# Patient Record
Sex: Female | Born: 1975
Health system: Southern US, Community
[De-identification: ages and names within clinical notes are randomized; demographics above are authoritative.]

## PROBLEM LIST (undated history)

## (undated) DIAGNOSIS — J302 Other seasonal allergic rhinitis: Secondary | ICD-10-CM

## (undated) DIAGNOSIS — I10 Essential (primary) hypertension: Secondary | ICD-10-CM

## (undated) DIAGNOSIS — D649 Anemia, unspecified: Secondary | ICD-10-CM

## (undated) HISTORY — PX: ESSURE TUBAL LIGATION: SUR464

## (undated) HISTORY — PX: LEG SURGERY: SHX1003

## (undated) HISTORY — DX: Essential (primary) hypertension: I10

## (undated) HISTORY — PX: LEEP: SHX91

## (undated) HISTORY — DX: Other seasonal allergic rhinitis: J30.2

---

## 2016-04-11 DIAGNOSIS — M7918 Myalgia, other site: Secondary | ICD-10-CM | POA: Insufficient documentation

## 2017-01-12 ENCOUNTER — Ambulatory Visit (INDEPENDENT_AMBULATORY_CARE_PROVIDER_SITE_OTHER): Payer: BC Managed Care – PPO | Admitting: Obstetrics and Gynecology

## 2017-01-12 ENCOUNTER — Encounter: Payer: Self-pay | Admitting: Obstetrics and Gynecology

## 2017-01-12 VITALS — BP 138/91 | HR 101 | Ht 60.0 in | Wt 169.3 lb

## 2017-01-12 DIAGNOSIS — Z1329 Encounter for screening for other suspected endocrine disorder: Secondary | ICD-10-CM | POA: Diagnosis not present

## 2017-01-12 DIAGNOSIS — N920 Excessive and frequent menstruation with regular cycle: Secondary | ICD-10-CM

## 2017-01-12 DIAGNOSIS — R102 Pelvic and perineal pain: Secondary | ICD-10-CM

## 2017-01-12 DIAGNOSIS — E559 Vitamin D deficiency, unspecified: Secondary | ICD-10-CM

## 2017-01-12 DIAGNOSIS — Z131 Encounter for screening for diabetes mellitus: Secondary | ICD-10-CM | POA: Diagnosis not present

## 2017-01-12 DIAGNOSIS — Z01419 Encounter for gynecological examination (general) (routine) without abnormal findings: Secondary | ICD-10-CM

## 2017-01-12 DIAGNOSIS — Z1231 Encounter for screening mammogram for malignant neoplasm of breast: Secondary | ICD-10-CM | POA: Diagnosis not present

## 2017-01-12 DIAGNOSIS — R1900 Intra-abdominal and pelvic swelling, mass and lump, unspecified site: Secondary | ICD-10-CM

## 2017-01-12 DIAGNOSIS — Z862 Personal history of diseases of the blood and blood-forming organs and certain disorders involving the immune mechanism: Secondary | ICD-10-CM

## 2017-01-12 DIAGNOSIS — Z9851 Tubal ligation status: Secondary | ICD-10-CM

## 2017-01-12 MED ORDER — TRANEXAMIC ACID 650 MG PO TABS: 1300.0000 mg | ORAL_TABLET | Freq: Three times a day (TID) | ORAL | 2 refills | Status: DC

## 2017-01-12 NOTE — Progress Notes (Signed)
GYNECOLOGY ANNUAL PHYSICAL EXAM PROGRESS NOTE  Subjective:    Donna Perry is a 41 y.o. G31P1001 female who presents for an annual exam. The patient is sexually active. The patient wears seatbelts: yes. The patient participates in regular exercise: no. Has the patient ever been transfused or tattooed?: no. The patient reports that there is not domestic violence in her life.    The patient has the following complaints today:  1. Patient notes that since having her Essure performed in 2011 she has gradually had the onset of more painful cycles and heavy vaginal bleeding. Pain is noted approximately 1 week prior to menses, and lasts through the first 1-2 days of the menstrual cycle. Patient takes Motrin scheduled during this week which does help some. She is also noting that during the first 1-2 days of her cycles she has to frequently change her pad approximately every 2 hours as the flow is the heaviest during this time, also with passage of clots. She has tried different birth control methods to help with her symptoms including Depo-Provera and combined birth control pills, however this caused her bleeding to become even heavier.   2.  Patient also notes having a history of recurrent vaginitis.  Notes that it varies between yeast infections and bacterial vaginosis. Usually the infections occur after her menstrual cycle. Thinks she may even have a yeast infection today.  Gynecologic History  Menarche age: 54 Patient's last menstrual period was 12/22/2016. Period Duration (Days): 5 Period Pattern: Regular Menstrual Flow: Heavy Dysmenorrhea: (!) Moderate Dysmenorrhea Symptoms: Cramping  Contraception: tubal ligation (Essure) History of STI's: Denies Last Pap: 2015. Results were: normal.  Remote h/o abnormal pap smears. H/o LEEP.  Last mammogram: never had one.   Obstetric History   G1   P1   T1   P0   A0   L1    SAB0   TAB0   Ectopic0   Multiple0   Live Births1     # Outcome Date GA Lbr  Len/2nd Weight Sex Delivery Anes PTL Lv  1 Term     M Vag-Spont   LIV      Past Medical History:  Diagnosis Date  . Hypertension   . Seasonal allergies     Past Surgical History:  Procedure Laterality Date  . ESSURE TUBAL LIGATION    . LEEP      Family History  Problem Relation Age of Onset  . Cervical cancer Mother   . Lung cancer Father   . Breast cancer Neg Hx   . Heart failure Neg Hx   . Seizures Neg Hx     Social History   Social History  . Marital status: Single    Spouse name: N/A  . Number of children: N/A  . Years of education: N/A   Occupational History  . Not on file.   Social History Main Topics  . Smoking status: Never Smoker  . Smokeless tobacco: Never Used  . Alcohol use Yes     Comment: Socially  . Drug use: No  . Sexual activity: Yes    Birth control/ protection: Surgical   Other Topics Concern  . Not on file   Social History Narrative  . No narrative on file    No current outpatient prescriptions on file prior to visit.   No current facility-administered medications on file prior to visit.     Allergies  Allergen Reactions  . Sulfamethoxazole-Trimethoprim Hives  . Sulfur  Review of Systems Constitutional: negative for chills, fatigue, fevers and sweats Eyes: negative for irritation, redness and visual disturbance Ears, nose, mouth, throat, and face: negative for hearing loss, nasal congestion, snoring and tinnitus Respiratory: negative for asthma, cough, sputum Cardiovascular: negative for chest pain, dyspnea, exertional chest pressure/discomfort, irregular heart beat, palpitations and syncope Gastrointestinal: negative for abdominal pain, change in bowel habits, nausea and vomiting Genitourinary: positive abnormal menstrual periods (heavy cycles and pelvic pain), positive for recurrent vaginal infections.  Negative for genital lesions, sexual problems and vaginal discharge, dysuria and urinary  incontinence Integument/breast: negative for breast lump, breast tenderness and nipple discharge Hematologic/lymphatic: negative for bleeding and easy bruising Musculoskeletal:negative for back pain and muscle weakness Neurological: negative for dizziness, headaches, vertigo and weakness Endocrine: negative for diabetic symptoms including polydipsia, polyuria and skin dryness Allergic/Immunologic: negative for hay fever and urticaria        Objective:  Blood pressure (!) 138/91, pulse (!) 101, height 5' (1.524 m), weight 169 lb 4.8 oz (76.8 kg), last menstrual period 12/22/2016. Body mass index is 33.06 kg/m.  General Appearance:    Alert, cooperative, no distress, appears stated age, obesity  Head:    Normocephalic, without obvious abnormality, atraumatic  Eyes:    PERRL, conjunctiva/corneas clear, EOM's intact, both eyes  Ears:    Normal external ear canals, both ears  Nose:   Nares normal, septum midline, mucosa normal, no drainage or sinus tenderness  Throat:   Lips, mucosa, and tongue normal; teeth and gums normal  Neck:   Supple, symmetrical, trachea midline, no adenopathy; thyroid: no enlargement/tenderness/nodules; no carotid bruit or JVD  Back:     Symmetric, no curvature, ROM normal, no CVA tenderness  Lungs:     Clear to auscultation bilaterally, respirations unlabored  Chest Wall:    No tenderness or deformity   Heart:    Regular rate and rhythm, S1 and S2 normal, no murmur, rub or gallop  Breast Exam:    No tenderness, masses, or nipple abnormality  Abdomen:     Soft, non-tender, bowel sounds active all four quadrants, no masses, no organomegaly.    Genitalia:    Pelvic:external genitalia normal, vagina without lesions, discharge, or tenderness, rectovaginal septum  normal. Cervix normal in appearance, no cervical motion tenderness, no adnexal masses or tenderness.  Uterus normal size, but large firm smooth mass on posterior surface, ~ 4-5 cm, regular contours, nontender.   Rectal:    Normal external sphincter.  No hemorrhoids appreciated. Internal exam not done.   Extremities:   Extremities normal, atraumatic, no cyanosis or edema  Pulses:   2+ and symmetric all extremities  Skin:   Skin color, texture, turgor normal, no rashes or lesions  Lymph nodes:   Cervical, supraclavicular, and axillary nodes normal  Neurologic:   CNII-XII intact, normal strength, sensation and reflexes throughout   .  Labs:  No results found for: WBC, HGB, HCT, MCV, PLT  No results found for: CREATININE, BUN, NA, K, CL, CO2  No results found for: ALT, AST, GGT, ALKPHOS, BILITOT  No results found for: TSH   Assessment:   Healthy female exam.   H/o Essure tubal ligation Pelvic pain Heavy menses H/o recurrent vaginitis H/o vitamin D deficiency H/o iron deficiency Pelvic mass   Plan:     Blood tests: CBC with diff, Comprehensive metabolic panel, Lipoproteins and TSH, Vitamin D, HgbA1c, Iron studies. Breast self exam technique reviewed and patient encouraged to perform self-exam monthly. Contraception: tubal ligation (Essure).  Discussed healthy lifestyle modifications. Pap smear performed today.   Iron studies for h/o iron deficiency Discussed options for management of pelvic pain with heavy menses, including Mirena IUD, daily progesterone OCP, Essure removal (would require referral to specialty center) and hysterectomy. Patient would like external referral to specialty center.  Follow up in 3-4 weeks for ultrasound, and in 1 year.     Rubie Maid, MD [facility]

## 2017-01-13 LAB — COMPREHENSIVE METABOLIC PANEL
A/G RATIO: 1.7 (ref 1.2–2.2)
ALBUMIN: 4.6 g/dL (ref 3.5–5.5)
ALK PHOS: 73 IU/L (ref 39–117)
ALT: 18 IU/L (ref 0–32)
AST: 21 IU/L (ref 0–40)
BILIRUBIN TOTAL: 0.3 mg/dL (ref 0.0–1.2)
BUN / CREAT RATIO: 12 (ref 9–23)
BUN: 9 mg/dL (ref 6–24)
CHLORIDE: 100 mmol/L (ref 96–106)
CO2: 27 mmol/L (ref 20–29)
Calcium: 9.6 mg/dL (ref 8.7–10.2)
Creatinine, Ser: 0.77 mg/dL (ref 0.57–1.00)
GFR calc non Af Amer: 97 mL/min/{1.73_m2} (ref >59)
GFR, EST AFRICAN AMERICAN: 112 mL/min/{1.73_m2} (ref >59)
GLOBULIN, TOTAL: 2.7 g/dL (ref 1.5–4.5)
Glucose: 67 mg/dL (ref 65–99)
Potassium: 3.4 mmol/L — ABNORMAL LOW (ref 3.5–5.2)
SODIUM: 144 mmol/L (ref 134–144)
TOTAL PROTEIN: 7.3 g/dL (ref 6.0–8.5)

## 2017-01-13 LAB — CBC
Hematocrit: 37.6 % (ref 34.0–46.6)
Hemoglobin: 12.4 g/dL (ref 11.1–15.9)
MCH: 27.6 pg (ref 26.6–33.0)
MCHC: 33 g/dL (ref 31.5–35.7)
MCV: 84 fL (ref 79–97)
PLATELETS: 374 10*3/uL (ref 150–379)
RBC: 4.5 x10E6/uL (ref 3.77–5.28)
RDW: 14 % (ref 12.3–15.4)
WBC: 10.2 10*3/uL (ref 3.4–10.8)

## 2017-01-13 LAB — HEMOGLOBIN A1C
Est. average glucose Bld gHb Est-mCnc: 108 mg/dL
HEMOGLOBIN A1C: 5.4 % (ref 4.8–5.6)

## 2017-01-13 LAB — TSH: TSH: 0.853 u[IU]/mL (ref 0.450–4.500)

## 2017-01-13 LAB — VITAMIN D 25 HYDROXY (VIT D DEFICIENCY, FRACTURES): Vit D, 25-Hydroxy: 38.7 ng/mL (ref 30.0–100.0)

## 2017-01-13 LAB — FERRITIN: FERRITIN: 16 ng/mL (ref 15–150)

## 2017-01-14 LAB — PAP IG AND HPV HIGH-RISK
HPV, HIGH-RISK: NEGATIVE
PAP SMEAR COMMENT: 0

## 2017-01-15 ENCOUNTER — Encounter: Payer: Self-pay | Admitting: Obstetrics and Gynecology

## 2017-01-19 ENCOUNTER — Telehealth: Payer: Self-pay

## 2017-01-19 NOTE — Telephone Encounter (Signed)
Called pt informed her of information below. Gave pt education on which foods to increase. Pt gave verbal understanding.

## 2017-01-19 NOTE — Telephone Encounter (Signed)
-----   Message from Rubie Maid, MD sent at 01/17/2017 11:12 PM EDT ----- Please inform patient that all of her labs were normal, including pap smear, except that she is slightly low in potassium. Advise to eat potassium-rich foods

## 2017-02-03 ENCOUNTER — Ambulatory Visit (INDEPENDENT_AMBULATORY_CARE_PROVIDER_SITE_OTHER): Payer: BC Managed Care – PPO | Admitting: Obstetrics and Gynecology

## 2017-02-03 ENCOUNTER — Ambulatory Visit (INDEPENDENT_AMBULATORY_CARE_PROVIDER_SITE_OTHER): Payer: BC Managed Care – PPO

## 2017-02-03 VITALS — BP 136/91 | HR 74 | Ht 60.0 in | Wt 168.5 lb

## 2017-02-03 DIAGNOSIS — N9989 Other postprocedural complications and disorders of genitourinary system: Secondary | ICD-10-CM | POA: Diagnosis not present

## 2017-02-03 DIAGNOSIS — N920 Excessive and frequent menstruation with regular cycle: Secondary | ICD-10-CM | POA: Diagnosis not present

## 2017-02-03 DIAGNOSIS — Z9851 Tubal ligation status: Secondary | ICD-10-CM | POA: Diagnosis not present

## 2017-02-03 DIAGNOSIS — D251 Intramural leiomyoma of uterus: Secondary | ICD-10-CM

## 2017-02-03 NOTE — Progress Notes (Signed)
GYNECOLOGY PROGRESS NOTE  Subjective:    Patient ID: Donna Perry, female    DOB: November 28, 1975, 41 y.o.   MRN: [id number]  HPI  Patient is a 41 y.o. G72P1001 female who presents for f/u of ultrasound results. She is currently being evaluated for h/o fibroids and heavy menstrual cycles (usually first 2 days) and pelvic pain (although this is thought to be caused by her Essure).   The following portions of the patient's history were reviewed and updated as appropriate: allergies, current medications, past family history, past medical history, past social history, past surgical history and problem list.  Review of Systems Pertinent items noted in HPI and remainder of comprehensive ROS otherwise negative.   Objective:   Blood pressure (!) 136/91, pulse 74, height 5' (1.524 m), weight 168 lb 8 oz (76.4 kg), last menstrual period 01/20/2017. General appearance: alert and no distress Remainder of exam deferred.    Labs:  Lab Results  Component Value Date   WBC 10.2 01/12/2017   HGB 12.4 01/12/2017   HCT 37.6 01/12/2017   MCV 84 01/12/2017   PLT 374 01/12/2017    Lab Results  Component Value Date   TSH 0.853 01/12/2017    Imaging:   ULTRASOUND REPORT  Location: [facility] Date of Service: 02/03/17   Indications:AUB Findings:  The uterus measures . Echo texture is heterogenous with evidence of focal mass. Within the uterus is a suspected fibroid measuring: Fibroid 1: left fundal intramural 1.4 x 1.9 x 1.4 cm.  The Endometrium measures 7.3 mm.  Right Ovary measures 2.8 x 2 x 1.5 cm. It is normal in appearance. Left Ovary measures 2.5 x 1.5 x 1.3 cm. It is normal appearance. Survey of the adnexa demonstrates no adnexal masses. There is no free fluid in the cul de sac.  Impression: 1. fibroid  Recommendations: 1.Clinical correlation with the patient's History and Physical Exam.  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 Reviewed ultrasound results with patient. Discussed that her fibroid was small, and not a likely cause of her heavy menses.  Patient notes that it has not changed in size much from the last time she had an ultrasound several years ago.  2. Discussed management options for heavy menses again.  Patient was prescribed Lysteda for her last menstrual cycle.  Notes that it made her period lighter, however also caused it to be 2 days longer (8 days instead of 6).  Notes that she will continue to use for now, as her main concern is her pelvic pain. Once her pain is managed, she will consider other options for management.  3. Patient notes pelvic pain ~ 1-2 weeks prior to her cycle, lasting until 2 days into her cycle.  Has been taking Ibuprofen 800 mg, which had offered relief in the past, however now is becoming less effective. Patient notes that she still desires Essure removal as she believes this has been the cause of most of her symptoms (including the change in the flow of her menses).  Informed that she should contact the closest facilities that specialize in Essure removals for a consultation.    To f/u as needed, after Essure removal.    A total of 15 minutes were spent face-to-face with the patient during this encounter and over half of that time dealt with counseling and coordination of care.   Rubie Maid, MD [facility]

## 2017-02-03 NOTE — Patient Instructions (Signed)
Pelvic Pain, Female Pelvic pain is pain in your lower abdomen, below your belly button and between your hips. The pain may start suddenly (acute), keep coming back (recurring), or last a long time (chronic). Pelvic pain that lasts longer than six months is considered chronic. Pelvic pain may affect your:  Reproductive organs.  Urinary system.  Digestive tract.  Musculoskeletal system.  There are many potential causes of pelvic pain. Sometimes, the pain can be a result of digestive or urinary conditions, strained muscles or ligaments, or even reproductive conditions. Sometimes the cause of pelvic pain is not known. Follow these instructions at home:  Take over-the-counter and prescription medicines only as told by your health care provider.  Rest as told by your health care provider.  Do not have sex it if hurts.  Keep a journal of your pelvic pain. Write down: ? When the pain started. ? Where the pain is located. ? What seems to make the pain better or worse, such as food or your menstrual cycle. ? Any symptoms you have along with the pain.  Keep all follow-up visits as told by your health care provider. This is important. Contact a health care provider if:  Medicine does not help your pain.  Your pain comes back.  You have new symptoms.  You have abnormal vaginal discharge or bleeding, including bleeding after menopause.  You have a fever or chills.  You are constipated.  You have blood in your urine or stool.  You have foul-smelling urine.  You feel weak or lightheaded. Get help right away if:  You have sudden severe pain.  Your pain gets steadily worse.  You have severe pain along with fever, nausea, vomiting, or excessive sweating.  You lose consciousness. This information is not intended to replace advice given to you by your health care provider. Make sure you discuss any questions you have with your health care provider. Document Released: 06/17/2004  Document Revised: 08/15/2015 Document Reviewed: 05/11/2015 Elsevier Interactive Patient Education  2018 [employer]  

## 2017-04-08 DIAGNOSIS — S92022A Displaced fracture of anterior process of left calcaneus, initial encounter for closed fracture: Secondary | ICD-10-CM | POA: Insufficient documentation

## 2017-05-02 ENCOUNTER — Encounter: Payer: Self-pay | Admitting: Obstetrics and Gynecology

## 2017-05-06 ENCOUNTER — Encounter: Payer: Self-pay | Admitting: Obstetrics and Gynecology

## 2017-05-06 ENCOUNTER — Ambulatory Visit (INDEPENDENT_AMBULATORY_CARE_PROVIDER_SITE_OTHER): Payer: BC Managed Care – PPO | Admitting: Obstetrics and Gynecology

## 2017-05-06 VITALS — BP 121/83 | HR 101

## 2017-05-06 DIAGNOSIS — Z9851 Tubal ligation status: Secondary | ICD-10-CM

## 2017-05-06 DIAGNOSIS — N938 Other specified abnormal uterine and vaginal bleeding: Secondary | ICD-10-CM

## 2017-05-06 DIAGNOSIS — N9989 Other postprocedural complications and disorders of genitourinary system: Secondary | ICD-10-CM | POA: Diagnosis not present

## 2017-05-06 NOTE — Patient Instructions (Addendum)
Dysfunctional Uterine Bleeding °Dysfunctional uterine bleeding is abnormal bleeding from the uterus. Dysfunctional uterine bleeding includes: °· A period that comes earlier or later than usual. °· A period that is lighter, heavier, or has blood clots. °· Bleeding between periods. °· Skipping one or more periods. °· Bleeding after sexual intercourse. °· Bleeding after menopause. ° °Follow these instructions at home: °Pay attention to any changes in your symptoms. Follow these instructions to help with your condition: °Eating and drinking °· Eat well-balanced meals. Include foods that are high in iron, such as liver, meat, shellfish, green leafy vegetables, and eggs. °· If you become constipated: °? Drink plenty of water. °? Eat fruits and vegetables that are high in water and fiber, such as spinach, carrots, raspberries, apples, and mango. °Medicines °· Take over-the-counter and prescription medicines only as told by your health care provider. °· Do not change medicines without talking with your health care provider. °· Aspirin or medicines that contain aspirin may make the bleeding worse. Do not take those medicines: °? During the week before your period. °? During your period. °· If you were prescribed iron pills, take them as told by your health care provider. Iron pills help to replace iron that your body loses because of this condition. °Activity °· If you need to change your sanitary pad or tampon more than one time every 2 hours: °? Lie in bed with your feet raised (elevated). °? Place a cold pack on your lower abdomen. °? Rest as much as possible until the bleeding stops or slows down. °· Do not try to lose weight until the bleeding has stopped and your blood iron level is back to normal. °Other Instructions °· For two months, write down: °? When your period starts. °? When your period ends. °? When any abnormal bleeding occurs. °? What problems you notice. °· Keep all follow up visits as told by your health  care provider. This is important. °Contact a health care provider if: °· You get light-headed or weak. °· You have nausea and vomiting. °· You cannot eat or drink without vomiting. °· You feel dizzy or have diarrhea while you are taking medicines. °· You are taking birth control pills or hormones, and you want to change them or stop taking them. °Get help right away if: °· You develop a fever or chills. °· You need to change your sanitary pad or tampon more than one time per hour. °· Your bleeding becomes heavier, or your flow contains clots more often. °· You develop pain in your abdomen. °· You lose consciousness. °· You develop a rash. °This information is not intended to replace advice given to you by your health care provider. Make sure you discuss any questions you have with your health care provider. °Document Released: 07/18/2000 Document Revised: 12/27/2015 Document Reviewed: 10/16/2014 °Elsevier Interactive Patient Education © 2018 Elsevier Inc. ° °

## 2017-05-06 NOTE — Progress Notes (Signed)
Pt is here with c/o abnormal vaginal bleeding. Started spotting 04/21/17, starting bleeding 04/29/17 not as heavy as usual, started bleeding heavier more normal flow on 05/03/17.Pt has Esure implant.

## 2017-05-06 NOTE — Progress Notes (Signed)
    GYNECOLOGY PROGRESS NOTE  Subjective:    Patient ID: Donna Perry, female    DOB: [birth year], 41 y.o.   MRN: [id number]  HPI  Patient is a 41 y.o. G64P1001 female who presents for complaints of dysfunctional uterine bleeding.  Notes that she started spotting on September 18 til April 29, 2017. Her cycle was not suppose to come on until September 29,2018.  She started to bleed a little more on September 27,2018 which was a little more than the spotting. She was also noting pelvic pain that was light and started on September 29,2018. Notes that by this time, her bleeding became heavier, which she believes was her normal cycle.  Cycle still ongoing, periods typically last 5-6 days.  Patient does have a h/o post-tubal ligation syndrome (with Essure), which she is working on seeking a specialist for Essure removal.  Has been on Lysteda for the past 2 months for her heavy periods. Notes that it had been working to decrease the flow, however it caused her periods to last 1-2 days longer.  Prior ultrasound performed in July noted essentially normal sono with a fundal 1.5 cm fibroid.   Of note, patient notes that she also started on some new medications, and also recently underwent surgery for her foot during this same time as some of these abnormalities started to ocur. .     The following portions of the patient's history were reviewed and updated as appropriate: allergies, current medications, past family history, past medical history, past social history, past surgical history and problem list.  Review of Systems Pertinent items noted in HPI and remainder of comprehensive ROS otherwise negative.   Objective:   Blood pressure 121/83, pulse (!) 101, last menstrual period 04/21/2017. General appearance: alert and no distress Abdomen: soft, non-tender; bowel sounds normal; no masses,  no organomegaly Pelvic: deferred, as patient unable to get on exam table due to leg contraption (orthopedic boot, not  supposed to be weight bearing).  Extremities: right extremity normal, atraumatic, no cyanosis or edema.  Left extremity in orthopedic boot, reaches up to knee.  Neurologic: Grossly intact.  Skin: no lesions, rashes.  Normal color and turgor.   Assessment:   Dysfunctional uterine bleeding Post-tubal ligation syndrome  Plan:   1. Dysfunctional bleeding.  Patient has had a h/o heavy menses, however this is the first episode of intermenstrual bleeding.  Discussed that it could be due to a number of causes, including recent stressful event of surgery, anesthesia, beginning several new medications, or could be completely unrelated.  Advised on waiting until next month to see if episode of intermenstrual bleeding occurs again.  If it does, can recommend trial of progesterone such as Provera or Aygestin (and discontinue Lysteda).  Would also recommend further evaluation at that time with endometrial biopsy.  Patient notes understanding.  To call if symptoms worsen or fail to improve.  2. Patient with a h/o pelvic pain secondary to post-tubal ligation syndrome prior to and during menses, however notes pain had improved with this cycle.  Is still seeking a specialist for Essure coil removal. Continue to manage with Ibuprofen as needed.    Rubie Maid, MD [facility]

## 2017-06-18 ENCOUNTER — Telehealth: Payer: Self-pay | Admitting: Obstetrics and Gynecology

## 2017-06-18 MED ORDER — IBUPROFEN 800 MG PO TABS: 800.0000 mg | ORAL_TABLET | Freq: Three times a day (TID) | ORAL | 0 refills | Status: DC | PRN

## 2017-06-18 NOTE — Telephone Encounter (Signed)
Pt aware per vm ibup erx

## 2017-06-18 NOTE — Telephone Encounter (Signed)
Patient called stating she needs a refill on 800 mg ibprofen. I dont see it in her chart. She uses the [facility] on [address]. Thanks

## 2017-11-17 ENCOUNTER — Encounter: Payer: Self-pay | Admitting: Obstetrics and Gynecology

## 2017-12-20 ENCOUNTER — Ambulatory Visit
Admission: EM | Admit: 2017-12-20 | Discharge: 2017-12-20 | Disposition: A | Discharge status: Home / Self Care | Payer: BC Managed Care – PPO | Attending: Family Medicine | Admitting: Family Medicine

## 2017-12-20 ENCOUNTER — Encounter: Payer: Self-pay | Admitting: Gynecology

## 2017-12-20 ENCOUNTER — Other Ambulatory Visit: Payer: Self-pay

## 2017-12-20 DIAGNOSIS — Z9109 Other allergy status, other than to drugs and biological substances: Secondary | ICD-10-CM | POA: Diagnosis not present

## 2017-12-20 MED ORDER — PREDNISONE 50 MG PO TABS: ORAL_TABLET | ORAL | 0 refills | Status: DC

## 2017-12-20 NOTE — ED Provider Notes (Signed)
[facility]    CSN: [id number] Arrival date & time: 12/20/17  1127  History   Chief Complaint Chief Complaint  Patient presents with  . Sinusitis   HPI  42 year old female presents with concerns for sinusitis.  Patient reports that she is been having severe allergy symptoms for the past 6 days.  She has had sore throat, postnasal drip, itchy/watery eyes, rhinorrhea.  She states that now she has some congestion.  She reports that her mucus seems to be changing color.  However, she continues to be plagued by symptoms which have not improved with use of Flonase, Xyzal, Allegra.  No fevers or chills.  No reports of purulent nasal discharge or severe sinus pain and pressure.  No other associated symptoms.  No other complaints/concerns at this time.  Past Medical History:  Diagnosis Date  . Hypertension   . Seasonal allergies    Past Surgical History:  Procedure Laterality Date  . ESSURE TUBAL LIGATION    . LEEP    . LEG SURGERY Left    OB History    Gravida  1   Para  1   Term  1   Preterm      AB      Living  1     SAB      TAB      Ectopic      Multiple      Live Births  1          Home Medications    Prior to Admission medications   Medication Sig Start Date End Date Taking? Authorizing Provider  Cholecalciferol (VITAMIN D-1000 MAX ST) 1000 units tablet Take by mouth.   Yes [provider]  EPINEPHrine (EPIPEN 2-PAK) 0.3 mg/0.3 mL IJ SOAJ injection Inject 0.3 mg into the muscle. 11/16/14  Yes [provider]  fexofenadine (ALLEGRA) 180 MG tablet Take 180 mg by mouth.   Yes [provider]  fluticasone (FLONASE) 50 MCG/ACT nasal spray 1 spray by Each Nare route daily.  11/16/14  Yes [provider]  hydrochlorothiazide (HYDRODIURIL) 25 MG tablet Take by mouth. 04/16/12  Yes [provider]  ibuprofen (ADVIL,MOTRIN) 800 MG tablet Take 1 tablet (800 mg total) every 8 (eight) hours as needed by mouth.  06/18/17  Yes Defrancesco, Alanda Slim, MD  levocetirizine (XYZAL) 5 MG tablet Take 5 mg by mouth every evening.   Yes [provider]  lisinopril (PRINIVIL,ZESTRIL) 20 MG tablet  02/03/17  Yes [provider]  calcium carbonate (OS-CAL) 1250 (500 Ca) MG chewable tablet Chew by mouth. 04/29/17 05/29/17  [provider]  predniSONE (DELTASONE) 50 MG tablet 1 tablet daily x 5 days. 12/20/17   Coral Spikes, DO    Family History Family History  Problem Relation Age of Onset  . Cervical cancer Mother   . Lung cancer Father   . Breast cancer Neg Hx   . Heart failure Neg Hx   . Seizures Neg Hx     Social History Social History   Tobacco Use  . Smoking status: Never Smoker  . Smokeless tobacco: Never Used  Substance Use Topics  . Alcohol use: Yes    Comment: Socially  . Drug use: No     Allergies   Sulfamethoxazole-trimethoprim and Sulfur   Review of Systems Review of Systems  Constitutional: Negative for fever.  HENT: Positive for congestion, postnasal drip, rhinorrhea and sore throat.   Eyes: Positive for itching.   Physical Exam  Triage Vital Signs ED Triage Vitals  Enc Vitals Group     BP 12/20/17 1139 (!) 142/98     Pulse Rate 12/20/17 1139 90     Resp 12/20/17 1139 16     Temp 12/20/17 1139 98.7 F (37.1 C)     Temp Source 12/20/17 1139 Oral     SpO2 12/20/17 1139 100 %     Weight 12/20/17 1141 167 lb (75.8 kg)     Height 12/20/17 1141 5' (1.524 m)     Head Circumference --      Peak Flow --      Pain Score 12/20/17 1141 0     Pain Loc --      Pain Edu? --      Excl. in [place]? --    Updated Vital Signs BP (!) 142/98 (BP Location: Left Arm)   Pulse 90   Temp 98.7 F (37.1 C) (Oral)   Resp 16   Ht 5' (1.524 m)   Wt 167 lb (75.8 kg)   LMP 11/26/2017   SpO2 100%   BMI 32.61 kg/m     Physical Exam  Constitutional: She is oriented to person, place, and time. She appears well-developed. No distress.  HENT:  Head: Normocephalic and  atraumatic.  Nose: Mucosal edema present.  Mouth/Throat: Oropharynx is clear and moist.  Neck: Neck supple.  Cardiovascular: Normal rate and regular rhythm.  Pulmonary/Chest: Effort normal and breath sounds normal.  Lymphadenopathy:    She has no cervical adenopathy.  Neurological: She is alert and oriented to person, place, and time.  Psychiatric: She has a normal mood and affect. Her behavior is normal.  Nursing note and vitals reviewed.   UC Treatments / Results  Labs (all labs ordered are listed, but only abnormal results are displayed) Labs Reviewed - No data to display  EKG None  Radiology No results found.  Procedures Procedures (including critical care time)  Medications Ordered in UC Medications - No data to display  Initial Impression / Assessment and Plan / UC Course  I have reviewed the triage vital signs and the nursing notes.  Pertinent labs & imaging results that were available during my care of the patient were reviewed by me and considered in my medical decision making (see chart for details).    42 year old female presents with allergic symptoms.  No evidence of sinusitis.  Treating with a short burst of prednisone.  Continue her home allergy medication.  Final Clinical Impressions(s) / UC Diagnoses   Final diagnoses:  Environmental allergies     Discharge Instructions     Prednisone as prescribed.  Continue allergy medications.  Take care  Dr. Lacinda Axon    ED Prescriptions    Medication Sig Dispense Auth. Provider   predniSONE (DELTASONE) 50 MG tablet 1 tablet daily x 5 days. 5 tablet Coral Spikes, DO     Controlled Substance Prescriptions [place] Controlled Substance Registry consulted? Not Applicable   Coral Spikes, DO 12/20/17 1243

## 2017-12-20 NOTE — Discharge Instructions (Signed)
Prednisone as prescribed.  Continue allergy medications.  Take care  Dr. Lacinda Axon

## 2017-12-20 NOTE — ED Triage Notes (Signed)
Per patient c/o sinus problem x 6 days.

## 2018-03-23 ENCOUNTER — Encounter: Payer: BC Managed Care – PPO | Admitting: Obstetrics and Gynecology

## 2018-06-17 ENCOUNTER — Other Ambulatory Visit: Payer: Self-pay

## 2018-06-17 ENCOUNTER — Ambulatory Visit
Admission: EM | Admit: 2018-06-17 | Discharge: 2018-06-17 | Disposition: A | Discharge status: Home / Self Care | Payer: BC Managed Care – PPO | Attending: Family Medicine | Admitting: Family Medicine

## 2018-06-17 DIAGNOSIS — J029 Acute pharyngitis, unspecified: Secondary | ICD-10-CM

## 2018-06-17 DIAGNOSIS — H6591 Unspecified nonsuppurative otitis media, right ear: Secondary | ICD-10-CM

## 2018-06-17 DIAGNOSIS — J069 Acute upper respiratory infection, unspecified: Secondary | ICD-10-CM

## 2018-06-17 LAB — RAPID STREP SCREEN (MED CTR MEBANE ONLY): Streptococcus, Group A Screen (Direct): NEGATIVE

## 2018-06-17 MED ORDER — AMOXICILLIN 875 MG PO TABS: 875.0000 mg | ORAL_TABLET | Freq: Two times a day (BID) | ORAL | 0 refills | Status: DC

## 2018-06-17 NOTE — Discharge Instructions (Addendum)
Take medication as prescribed. Rest. Drink plenty of fluids.  ° °Follow up with your primary care physician this week as needed. Return to Urgent care for new or worsening concerns.  ° °

## 2018-06-17 NOTE — ED Provider Notes (Signed)
[facility] ____________________________________________  Time seen: Approximately 1:09 PM  I have reviewed the triage vital signs and the nursing notes.   HISTORY  Chief Complaint Sore Throat   HPI Donna Perry is a 42 y.o. female presenting for evaluation of sore throat and right ear discomfort.  Patient reports this past weekend after she went to a football game she had 2 days of loss of voice and hoarse voice.  States during that time she had no sore throat.  However reports for the last 2 to 3 days she is now had development of sore throat that has continued to increase.  States sore throat is worse with swallowing, but overall is continued to eat and drink well.  Also reports accompanying right ear discomfort that has been present for several days.  Minimal cough, some runny nose and nasal congestion.  Unsure of seasonal allergies.  Denies known direct sick contacts.  Unresolved with over-the-counter cough and congestion medication.  Has continued to remain active.  Denies known fevers.  Reports otherwise doing well.  Denies other complaints.  Denies drinking after others.  Elijah Birk, [place]: PCP   Past Medical History:  Diagnosis Date  . Hypertension   . Seasonal allergies     There are no active problems to display for this patient.   Past Surgical History:  Procedure Laterality Date  . ESSURE TUBAL LIGATION    . LEEP    . LEG SURGERY Left      No current facility-administered medications for this encounter.   Current Outpatient Medications:  .  acidophilus (RISAQUAD) CAPS capsule, Take by mouth daily., Disp: , Rfl:  .  calcium carbonate (OS-CAL) 1250 (500 Ca) MG chewable tablet, Chew by mouth., Disp: , Rfl:  .  Cholecalciferol (VITAMIN D-1000 MAX ST) 1000 units tablet, Take by mouth., Disp: , Rfl:  .  EPINEPHrine (EPIPEN 2-PAK) 0.3 mg/0.3 mL IJ SOAJ injection, Inject 0.3 mg into the muscle., Disp: , Rfl:  .  ferrous sulfate 325 (65 FE) MG EC tablet,  Take 325 mg by mouth 3 (three) times daily with meals., Disp: , Rfl:  .  fexofenadine (ALLEGRA) 180 MG tablet, Take 180 mg by mouth., Disp: , Rfl:  .  fluticasone (FLONASE) 50 MCG/ACT nasal spray, 1 spray by Each Nare route daily. , Disp: , Rfl:  .  hydrochlorothiazide (HYDRODIURIL) 25 MG tablet, Take by mouth., Disp: , Rfl:  .  ibuprofen (ADVIL,MOTRIN) 800 MG tablet, Take 1 tablet (800 mg total) every 8 (eight) hours as needed by mouth., Disp: 30 tablet, Rfl: 0 .  levocetirizine (XYZAL) 5 MG tablet, Take 5 mg by mouth every evening., Disp: , Rfl:  .  lisinopril (PRINIVIL,ZESTRIL) 20 MG tablet, , Disp: , Rfl:  .  Turmeric 400 MG CAPS, Take by mouth., Disp: , Rfl:  .  amoxicillin (AMOXIL) 875 MG tablet, Take 1 tablet (875 mg total) by mouth 2 (two) times daily., Disp: 20 tablet, Rfl: 0  Allergies Sulfamethoxazole-trimethoprim and Sulfur  Family History  Problem Relation Age of Onset  . Cervical cancer Mother   . Lung cancer Father   . Breast cancer Neg Hx   . Heart failure Neg Hx   . Seizures Neg Hx     Social History Social History   Tobacco Use  . Smoking status: Never Smoker  . Smokeless tobacco: Never Used  Substance Use Topics  . Alcohol use: Yes    Comment: Socially  . Drug use: No  Review of Systems Constitutional: No fever ENT: Positive sore throat. As above.  Cardiovascular: Denies chest pain. Respiratory: Denies shortness of breath. Gastrointestinal: No abdominal pain.   Musculoskeletal: Negative for back pain. Skin: Negative for rash.   ____________________________________________   PHYSICAL EXAM:  VITAL SIGNS: ED Triage Vitals  Enc Vitals Group     BP 06/17/18 1113 (!) 130/93     Pulse Rate 06/17/18 1113 93     Resp 06/17/18 1113 18     Temp 06/17/18 1113 98.3 F (36.8 C)     Temp Source 06/17/18 1113 Oral     SpO2 06/17/18 1113 99 %     Weight 06/17/18 1110 163 lb (73.9 kg)     Height 06/17/18 1110 5' (1.524 m)     Head Circumference --       Peak Flow --      Pain Score 06/17/18 1110 5     Pain Loc --      Pain Edu? --      Excl. in [place]? --     Constitutional: Alert and oriented. Well appearing and in no acute distress. Eyes: Conjunctivae are normal.  Head: Atraumatic. No sinus tenderness to palpation. No swelling. No erythema.  Ears: Left: Nontender, no erythema, normal TM.  Right: Nontender, normal canal, moderate erythema with effusion present.  No mastoid tenderness bilaterally.  Nose:Nasal congestion  Mouth/Throat: Mucous membranes are moist. Mild pharyngeal erythema.  Mild bilateral tonsillar swelling with scant bilateral exudate.  No uvular shift or deviation. Neck: No stridor.  No cervical spine tenderness to palpation. Hematological/Lymphatic/Immunilogical: Mild anterior bilateral cervical lymphadenopathy. Cardiovascular: Normal rate, regular rhythm. Grossly normal heart sounds.  Good peripheral circulation. Respiratory: Normal respiratory effort.  No retractions. No wheezes, rales or rhonchi. Good air movement.  Gastrointestinal: Soft and nontender.  Musculoskeletal: Ambulatory with steady gait.  Neurologic:  Normal speech and language. No gait instability. Skin:  Skin appears warm, dry and intact. No rash noted. Psychiatric: Mood and affect are normal. Speech and behavior are normal.  ___________________________________________   LABS (all labs ordered are listed, but only abnormal results are displayed)  Labs Reviewed  RAPID STREP SCREEN ([facility] ONLY)  CULTURE, GROUP A STREP [facility])    PROCEDURES Procedures   INITIAL IMPRESSION / ASSESSMENT AND PLAN / ED COURSE  Pertinent labs & imaging results that were available during my care of the patient were reviewed by me and considered in my medical decision making (see chart for details).  Well-appearing patient.  No acute distress.  Suspect viral upper respiratory infection versus seasonal allergies.  Patient does have right otitis with with effusion.   Quick strep negative, will culture.  However with exudative appearance beginning, suspicious for streptococcal pharyngitis.  Will treat with oral amoxicillin.  Encourage rest, fluids, supportive care.Discussed indication, risks and benefits of medications with patient.  Discussed follow up with Primary care physician this week. Discussed follow up and return parameters including no resolution or any worsening concerns. Patient verbalized understanding and agreed to plan.   ____________________________________________   FINAL CLINICAL IMPRESSION(S) / ED DIAGNOSES  Final diagnoses:  Right otitis media with effusion  Pharyngitis, unspecified etiology  Upper respiratory tract infection, unspecified type     ED Discharge Orders         Ordered    amoxicillin (AMOXIL) 875 MG tablet  2 times daily     06/17/18 1222           Note: This dictation was prepared with  Dragon dictation along with smaller Company secretary. Any transcriptional errors that result from this process are unintentional.         Marylene Land, NP 06/17/18 [id number]

## 2018-06-17 NOTE — ED Triage Notes (Signed)
Patient complains of sore throat that started gradually over the weekend. Patient states that pain is worse with swallowing, also noticed a cough and runny nose.

## 2018-06-20 LAB — CULTURE, GROUP A STREP (THRC)

## 2018-06-21 ENCOUNTER — Telehealth (HOSPITAL_COMMUNITY): Payer: Self-pay | Admitting: Emergency Medicine

## 2018-06-21 NOTE — Telephone Encounter (Signed)
Culture is positive for non group A Strep germ.  This is a finding of uncertain significance; not the typical 'strep throat' germ.  Attempted to reach patient. No answer

## 2018-07-06 ENCOUNTER — Ambulatory Visit: Payer: BC Managed Care – PPO | Admitting: Obstetrics and Gynecology

## 2018-07-06 ENCOUNTER — Encounter: Payer: Self-pay | Admitting: Obstetrics and Gynecology

## 2018-07-06 VITALS — BP 113/79 | HR 93 | Ht 60.0 in | Wt 164.4 lb

## 2018-07-06 DIAGNOSIS — D219 Benign neoplasm of connective and other soft tissue, unspecified: Secondary | ICD-10-CM | POA: Diagnosis not present

## 2018-07-06 DIAGNOSIS — N760 Acute vaginitis: Secondary | ICD-10-CM

## 2018-07-06 DIAGNOSIS — N939 Abnormal uterine and vaginal bleeding, unspecified: Secondary | ICD-10-CM

## 2018-07-06 DIAGNOSIS — B372 Candidiasis of skin and nail: Secondary | ICD-10-CM

## 2018-07-06 DIAGNOSIS — B9689 Other specified bacterial agents as the cause of diseases classified elsewhere: Secondary | ICD-10-CM

## 2018-07-06 MED ORDER — METRONIDAZOLE 0.75 % VA GEL: 1.0000 | Freq: Every day | VAGINAL | 0 refills | Status: DC

## 2018-07-06 MED ORDER — NYSTATIN 100000 UNIT/GM EX POWD: Freq: Two times a day (BID) | CUTANEOUS | 0 refills | Status: DC

## 2018-07-06 NOTE — Progress Notes (Signed)
Pt stated that her cycles have been irregular. Pt stated that she has been noticing spotting throughout her cycle and in October lasting 10 days.

## 2018-07-06 NOTE — Patient Instructions (Addendum)
Abnormal Uterine Bleeding Abnormal uterine bleeding can affect women at various stages in life, including teenagers, women in their reproductive years, pregnant women, and women who have reached menopause. Several kinds of uterine bleeding are considered abnormal, including:  Bleeding or spotting between periods.  Bleeding after sexual intercourse.  Bleeding that is heavier or more than normal.  Periods that last longer than usual.  Bleeding after menopause. Many cases of abnormal uterine bleeding are minor and simple to treat, while others are more serious. Any type of abnormal bleeding should be evaluated by your health care provider. Treatment will depend on the cause of the bleeding. Follow these instructions at home: Monitor your condition for any changes. The following actions may help to alleviate any discomfort you are experiencing:  Avoid the use of tampons and douches as directed by your health care provider.  Change your pads frequently. You should get regular pelvic exams and Pap tests. Keep all follow-up appointments for diagnostic tests as directed by your health care provider. Contact a health care provider if:  Your bleeding lasts more than 1 week.  You feel dizzy at times. Get help right away if:  You pass out.  You are changing pads every 15 to 30 minutes.  You have abdominal pain.  You have a fever.  You become sweaty or weak.  You are passing large blood clots from the vagina.  You start to feel nauseous and vomit. This information is not intended to replace advice given to you by your health care provider. Make sure you discuss any questions you have with your health care provider. Document Released: 07/21/2005 Document Revised: 01/02/2016 Document Reviewed: 02/17/2013 Elsevier Interactive Patient Education  2017 Elsevier Inc.  

## 2018-07-06 NOTE — Progress Notes (Signed)
GYNECOLOGY PROGRESS NOTE  Subjective:    Patient ID: Donna Perry, female    DOB: [birth year], 42 y.o.   MRN: [id number]  HPI  Patient is a 42 y.o. G87P1001 female who presents for complaints of continued heavy cycles and intermenstrual bleeding. She was seen for similar complaints ~ 1 year ago.  She notes her cycles have always been fairly heavy, however since October, they have also bee lasting for 10 days. She states that she wakes up at night fearful that she is going to soil her sheets because of the heavy flow. She also complains of intermenstrual bleeding for the past 2 months.  Had an episode ~ 1 year ago which resolved.  She has a history of fibroid uterus for several years. Last ultrasound last year noted a solitary fibroid of ~ 1.5 cm. Denies pain with menses.   Additionally she complains of a thin white discharge for the past week. Denies any itching or burning.   The following portions of the patient's history were reviewed and updated as appropriate: allergies, current medications, past family history, past medical history, past social history, past surgical history and problem list.  Review of Systems A comprehensive review of systems was negative except for: Integument/breast: positive for rash underneath breasts with darkening of the skin, mild itching.  Also what is noted In HPI.   Objective:   Blood pressure 113/79, pulse 93, height 5' (1.524 m), weight 164 lb 6.4 oz (74.6 kg), last menstrual period 06/16/2018. General appearance: alert and no distress  Breasts: breasts appear normal, no suspicious masses, no nipple changes. Small area of darkening beneath the breasts bilaterally, moist.  Abdomen: soft, non-tender; bowel sounds normal; no masses,  no organomegaly Pelvic: external genitalia normal, rectovaginal septum normal.  Vagina with small amount of thin white discharge.  Cervix normal appearing, no lesions and no motion tenderness.  Uterus mobile, nontender, normal shape  and size.  Adnexae non-palpable, nontender bilaterally.  Extremities: extremities normal, atraumatic, no cyanosis or edema Neurologic: Grossly normal   Microscopic wet-mount exam shows clue cells. No trichomonads or yeast.  KOH done, positive amine test.   Assessment:   Menometrorrhagia Fibroid uterus Bacterial vaginosis Yeast dermatitis  Plan:   1. Menometrorrhagia. She has a normal exam, no evidence of lesions.  Will order abnormal uterine bleeding evaluation labs.  Will hold off on pelvic ultrasound as patient has had fibroids for years, and only had a 1 cm fibroid on last sono. Also normal uterine size noted on exam. Can consider if surgical intervention desired or abnormal bleeding does not resolve with medical interventions. Discussed need for endometrial biopsy.  Patient will plan to perform at next visit.  2. Discussed management options for abnormal bleeding again.  Patient has tried Depo Provera and pills in the past which caused worsening of bleeding. Tried Lysteda last year which did not help her symptoms.  Declines definitive surgical management with hysterectomy.  Was initially fearful of IUD usage or endometrial ablation due to currently having Essure coils, however after further discussion is more open to this. Additionally discussed use of progesterone (i.e. Provera or aygestin for periods). Patient desires to think over options.  3. Bacterial vaginosis. Will treat with PO Flagyl x 7 days.  4. Yeast dermatitis under breasts.  Patient notes that she has been exercising more recently. Advised on use of cotton sports bras. Will also prescribe Nystatin powder to use twice daily.  5. RTC 2 weeks.     Rubie Maid,  MD [facility]

## 2018-07-07 LAB — ESTRADIOL: ESTRADIOL: 123.8 pg/mL

## 2018-07-07 LAB — FSH/LH
FSH: 5.1 m[IU]/mL
LH: 11 m[IU]/mL

## 2018-07-07 LAB — PROGESTERONE: Progesterone: 9.3 ng/mL

## 2018-07-20 ENCOUNTER — Ambulatory Visit (INDEPENDENT_AMBULATORY_CARE_PROVIDER_SITE_OTHER): Payer: BC Managed Care – PPO | Admitting: Obstetrics and Gynecology

## 2018-07-20 ENCOUNTER — Encounter: Payer: Self-pay | Admitting: Obstetrics and Gynecology

## 2018-07-20 ENCOUNTER — Other Ambulatory Visit (HOSPITAL_COMMUNITY)
Admission: RE | Admit: 2018-07-20 | Discharge: 2018-07-20 | Disposition: A | Discharge status: Home / Self Care | Payer: BC Managed Care – PPO | Source: Ambulatory Visit | Attending: Obstetrics and Gynecology | Admitting: Obstetrics and Gynecology

## 2018-07-20 VITALS — BP 124/84 | HR 88 | Ht 60.0 in | Wt 162.9 lb

## 2018-07-20 DIAGNOSIS — N939 Abnormal uterine and vaginal bleeding, unspecified: Secondary | ICD-10-CM

## 2018-07-20 DIAGNOSIS — N971 Female infertility of tubal origin: Secondary | ICD-10-CM | POA: Diagnosis not present

## 2018-07-20 NOTE — Progress Notes (Signed)
    GYNECOLOGY PROGRESS NOTE  Subjective:    Patient ID: Donna Perry, female    DOB: 15-Aug-1975, 42 y.o.   MRN: [id number]  HPI  Patient is a 42 y.o. G29P1001 female with h/o fibroid uterus who presents for discussion of lab results and for endometrial biopsy.  She has a history of heavy menstrual cycles and intermenstrual bleeding.   The following portions of the patient's history were reviewed and updated as appropriate: allergies, current medications, past family history, past medical history, past social history, past surgical history and problem list.  Review of Systems Pertinent items noted in HPI and remainder of comprehensive ROS otherwise negative.   Objective:   Blood pressure 124/84, pulse 88, height 5' (1.524 m), weight 162 lb 14.4 oz (73.9 kg), last menstrual period 07/14/2018. General appearance: alert and no distress Abdomen: soft, non-tender; bowel sounds normal; no masses,  no organomegaly Pelvic: external genitalia normal, rectovaginal septum normal.  Vagina without discharge.  Cervix normal appearing, no lesions and no motion tenderness.  Uterus mobile, nontender, normal shape and size.  Adnexae non-palpable, nontender bilaterally.  Extremities: extremities normal, atraumatic, no cyanosis or edema Neurologic: Grossly normal   Assessment:   Abnormal uterine bleeding  Plan:   1. AUB - She has a normal exam, no evidence of lesions. Will order abnormal uterine bleeding evaluation labs.  Will hold off on pelvic ultrasound as patient has had fibroids for years, and only had a 1 cm fibroid on last sono. Also normal uterine size noted on exam. Endometrial biopsy performed today.  2.  Lengthy discussion had regarding management options for abnormal bleeding again.  Patient has tried Depo Provera and pills in the past which caused worsening of bleeding. Tried Lysteda last year which did not help her symptoms.  Declines definitive surgical management with hysterectomy. Still  hesitant about IUD due to the presence of IUD coils.  Also hesitant about use of hormone therapy. Patient states that she truly thinks the cause of her bleeding is due to her Essure coils and would like to have them removed. Investigated the cost of having them removed several years ago, and noted that it would cost thousands of dollars as insurance would not cover it at that time and recommended hysterectomy. Advised patient to check with her insurance company again as things may have changed and this is what she truly desires.  3. To f/u once she has contacted her insurance company if Essure removal was still not an option for her.     Endometrial Biopsy Procedure Note  The patient is positioned on the exam table in the dorsal lithotomy position. Bimanual exam confirms uterine position and size. A Graves speculum is placed into the vagina. A single toothed tenaculum is placed onto the anterior lip of the cervix. The pipette is placed into the endocervical canal and is advanced to the uterine fundus. Using a piston like technique, with vacuum created by withdrawing the stylus, the endometrial specimen is obtained and transferred to the biopsy container. Minimal bleeding is encountered. The procedure is well tolerated.   Uterine Position: mid    Uterine Length:  8 cm   Uterine Specimen: Average   Post procedure instructions are given. The patient is scheduled for follow up appointment.    A total of 25 minutes were spent face-to-face with the patient during this encounter and over half of that time involved counseling and coordination of care.  Rubie Maid, MD [facility]

## 2018-07-20 NOTE — Progress Notes (Signed)
Pt is present today for lab results. No problems or concerns to report at this time.

## 2018-07-24 ENCOUNTER — Encounter: Payer: Self-pay | Admitting: Obstetrics and Gynecology

## 2018-12-31 DIAGNOSIS — N9989 Other postprocedural complications and disorders of genitourinary system: Secondary | ICD-10-CM

## 2019-01-17 NOTE — Telephone Encounter (Signed)
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   Dr. Marcelline Mates

## 2019-01-18 ENCOUNTER — Other Ambulatory Visit: Payer: Self-pay

## 2019-01-18 DIAGNOSIS — N939 Abnormal uterine and vaginal bleeding, unspecified: Secondary | ICD-10-CM

## 2019-01-18 MED ORDER — MEDROXYPROGESTERONE ACETATE 10 MG PO TABS: 10.0000 mg | ORAL_TABLET | Freq: Every day | ORAL | 0 refills | Status: DC

## 2019-01-19 ENCOUNTER — Other Ambulatory Visit: Payer: Self-pay

## 2019-01-19 ENCOUNTER — Telehealth: Payer: Self-pay | Admitting: Obstetrics and Gynecology

## 2019-01-19 ENCOUNTER — Other Ambulatory Visit: Payer: BC Managed Care – PPO

## 2019-01-19 DIAGNOSIS — N939 Abnormal uterine and vaginal bleeding, unspecified: Secondary | ICD-10-CM

## 2019-01-19 NOTE — Telephone Encounter (Signed)
The patient called and stated that she spoke with Dr. Marcelline Mates via message, and was advised to schedule an appointment with Dr. Marcelline Mates. Dr. Marcelline Mates does not have any openings and I need approval for an appointment. The patient also mentioned that she needs a late afternoon apt due to her work schedule. Please advise.

## 2019-01-19 NOTE — Telephone Encounter (Signed)
Pt called to set an appointment with [facility] was advised by [facility] to place the pt on Thursday schedule. Pt prescreened no symptoms has face mask.    Coronavirus (COVID-19) Are you at risk?  Are you at risk for the Coronavirus (COVID-19)?  To be considered HIGH RISK for Coronavirus (COVID-19), you have to meet the following criteria:  . Traveled to [place], [place], [place], [place] or [place]; or in the [place] to [place], [place], [place], or [place]; and have fever, cough, and shortness of breath within the last 2 weeks of travel OR . Been in close contact with a person diagnosed with COVID-19 within the last 2 weeks and have fever, cough, and shortness of breath . IF YOU DO NOT MEET THESE CRITERIA, YOU ARE CONSIDERED LOW RISK FOR COVID-19.  What to do if you are HIGH RISK for COVID-19?  Marland Kitchen If you are having a medical emergency, call 911. . Seek medical care right away. Before you go to a doctor's office, urgent care or emergency department, call ahead and tell them about your recent travel, contact with someone diagnosed with COVID-19, and your symptoms. You should receive instructions from your physician's office regarding next steps of care.  . When you arrive at healthcare provider, tell the healthcare staff immediately you have returned from visiting [place], [place], [place], [place] or [place]; or traveled in the [place] to [place], [place], [place], or [place]; in the last two weeks or you have been in close contact with a person diagnosed with COVID-19 in the last 2 weeks.   . Tell the health care staff about your symptoms: fever, cough and shortness of breath. . After you have been seen by a medical provider, you will be either: o Tested for (COVID-19) and discharged home on quarantine except to seek medical care if symptoms worsen, and asked to  - Stay home and avoid contact with others until you get your results (4-5 days)  - Avoid travel on public transportation if  possible (such as bus, train, or airplane) or o Sent to the Emergency Department by EMS for evaluation, COVID-19 testing, and possible admission depending on your condition and test results.  What to do if you are LOW RISK for COVID-19?  Reduce your risk of any infection by using the same precautions used for avoiding the common cold or flu:  Marland Kitchen Wash your hands often with soap and warm water for at least 20 seconds.  If soap and water are not readily available, use an alcohol-based hand sanitizer with at least 60% alcohol.  . If coughing or sneezing, cover your mouth and nose by coughing or sneezing into the elbow areas of your shirt or coat, into a tissue or into your sleeve (not your hands). . Avoid shaking hands with others and consider head nods or verbal greetings only. . Avoid touching your eyes, nose, or mouth with unwashed hands.  . Avoid close contact with people who are sick. . Avoid places or events with large numbers of people in one location, like concerts or sporting events. . Carefully consider travel plans you have or are making. . If you are planning any travel outside or inside the [place], visit the CDC's Travelers' Health webpage for the latest health notices. . If you have some symptoms but not all symptoms, continue to monitor at home and seek medical attention if your symptoms worsen. . If you are having a medical emergency, call 911.  [place] / e-Visit: [email or web address]         [facility]: [facility]: [phone]                   [facility]: [phone]

## 2019-01-20 ENCOUNTER — Encounter: Payer: Self-pay | Admitting: Obstetrics and Gynecology

## 2019-01-20 ENCOUNTER — Ambulatory Visit: Payer: BC Managed Care – PPO | Admitting: Obstetrics and Gynecology

## 2019-01-20 VITALS — BP 120/80 | HR 116 | Ht 60.0 in | Wt 159.7 lb

## 2019-01-20 DIAGNOSIS — N946 Dysmenorrhea, unspecified: Secondary | ICD-10-CM | POA: Diagnosis not present

## 2019-01-20 DIAGNOSIS — N921 Excessive and frequent menstruation with irregular cycle: Secondary | ICD-10-CM

## 2019-01-20 DIAGNOSIS — R102 Pelvic and perineal pain: Secondary | ICD-10-CM | POA: Diagnosis not present

## 2019-01-20 DIAGNOSIS — N9989 Other postprocedural complications and disorders of genitourinary system: Secondary | ICD-10-CM | POA: Diagnosis not present

## 2019-01-20 DIAGNOSIS — D259 Leiomyoma of uterus, unspecified: Secondary | ICD-10-CM

## 2019-01-20 DIAGNOSIS — D500 Iron deficiency anemia secondary to blood loss (chronic): Secondary | ICD-10-CM

## 2019-01-20 DIAGNOSIS — Z9851 Tubal ligation status: Secondary | ICD-10-CM

## 2019-01-20 LAB — CBC
Hematocrit: 27.6 % — ABNORMAL LOW (ref 34.0–46.6)
Hemoglobin: 8.7 g/dL — ABNORMAL LOW (ref 11.1–15.9)
MCH: 23.6 pg — ABNORMAL LOW (ref 26.6–33.0)
MCHC: 31.5 g/dL (ref 31.5–35.7)
MCV: 75 fL — ABNORMAL LOW (ref 79–97)
Platelets: 446 10*3/uL (ref 150–450)
RBC: 3.69 x10E6/uL — ABNORMAL LOW (ref 3.77–5.28)
RDW: 16.2 % — ABNORMAL HIGH (ref 11.7–15.4)
WBC: 6.8 10*3/uL (ref 3.4–10.8)

## 2019-01-20 LAB — TSH: TSH: 1.22 u[IU]/mL (ref 0.450–4.500)

## 2019-01-20 MED ORDER — DOCUSATE SODIUM 100 MG PO CAPS: 100.0000 mg | ORAL_CAPSULE | Freq: Two times a day (BID) | ORAL | 2 refills | Status: DC | PRN

## 2019-01-20 NOTE — Progress Notes (Signed)
GYNECOLOGY PROGRESS NOTE  Subjective:    Patient ID: Donna Perry, female    DOB: [birth year], 43 y.o.   MRN: [id number]  HPI  Patient is a 43 y.o. G26P1001 female who presents for complaints of worsening menometrorrhagia and dysmenorrhea.  She states that her period started on Thursday, and beginning on Friday her cycle was so heavy that she felt that she passed at least 20 clots.  Reports that when she tried to ambulate to restroom that evening she felt as if she were about to pass out.  She also has begun experiencing palpitations, fatigue, and weakness.  Cycles are still occurring every 2-3 weeks. She reports that when she was last seen in  March or April by her PCP, she had a fingerstick Hgb that was noted to be at ~ 10.  Was told to start iron daily.  Patient came in for labs 2 days ago, current Hgb 8.7. Is taking Ibuprofen 800 mg every 4-6 hours to help with her dysmenorrhea. She was known to have a small 1 cm fibroid on a prior ultrasound.  She has tried OCPs and Lysteda which only helped for a short time before symptoms returned. Still declines definitive management with hysterectomy.   Of note, patient still in search of someone to remove her Essure coils. Due to insurance, she is no longer able to go to [facility] (a clinic in [place] that performs removal of Essure coils. Notes that she believes this is also a factor in her pain (as well as her bleeding).   The following portions of the patient's history were reviewed and updated as appropriate: allergies, current medications, past family history, past medical history, past social history, past surgical history and problem list.  Review of Systems Pertinent items noted in HPI and remainder of comprehensive ROS otherwise negative.   Objective:   Blood pressure 120/80, pulse (!) 116, height 5' (1.524 m), weight 159 lb 11.2 oz (72.4 kg). General appearance: alert and no distress Abdomen: soft, non-tender; bowel sounds normal; no  masses,  no organomegaly Pelvic: deferred   Labs:  Results for orders placed or performed in visit on 01/19/19  CBC  Result Value Ref Range   WBC 6.8 3.4 - 10.8 x10E3/uL   RBC 3.69 (L) 3.77 - 5.28 x10E6/uL   Hemoglobin 8.7 (L) 11.1 - 15.9 g/dL   Hematocrit 27.6 (L) 34.0 - 46.6 %   MCV 75 (L) 79 - 97 fL   MCH 23.6 (L) 26.6 - 33.0 pg   MCHC 31.5 31.5 - 35.7 g/dL   RDW 16.2 (H) 11.7 - 15.4 %   Platelets 446 150 - 450 x10E3/uL  TSH  Result Value Ref Range   TSH 1.220 0.450 - 4.500 uIU/mL     Assessment:   Menometrorrhagia Anemia History of Essure procedure Pelvic pain Dysmenorrhea Fibroid uterus  Plan:   - Menometrorrhagia -  Patient notes that bleeding has just about subsided now. Prescribed Provera for next cyle as she typically has one every 2 weeks. Has had negative workup with pap and endometrial biopsy in December. Labs today noting anemia, normal TSH. Discussed again other options for management of her cycles including suppression with progesterone (Aygestin, IUD, Depo Provera).  Patient still declining medical management at this time. Discussed surgical option of D&C, endometrial ablation. Patient declines definitive management.  Notes she would like to consider the D&C.  - History of Essure - patient desires Essure coil removal. Has been working to have this procedure  for almost 1 year.  Discussion had again regarding patient's desires as this is typically performed for re-establishment of fertility. Patient notes that she is not looking to preserve fertility, is ok if entire tube is removed, she just did not desires a hysterectomy. Advised that a salpingectomy could be performed and did not require a specialist.  Will schedule for laparoscopic bilateral salpingectomy with Essure coil removal.  - Advised patient on limiting excessive use of NSAIDs.  Advised to alternate with Ibuprofen and Tylenol. Can also prescribe something stronger if needed such as a stronger NSAID like  Toradol (to reduce overuse of OTC use) or Tramadol.  - Fibroid uterus, last noted to be 1 cm. Not concerned that this could be a cause of her abnormal bleeding due to size.  - Anemia worsened with blood loss. Advised to increase use of iron to twice daily. Will also prescribe a stool softener.  - Patient to f//u in 3-4 weeks for pre--op exam. To schedule for laparoscopic bilateral salpingectomy with Essure coil removal, and D&C. To schedule for 02/25/2019.   A total of 15 minutes were spent face-to-face with the patient during this encounter and over half of that time dealt with counseling and coordination of care.   Rubie Maid, MD [facility] 01/21/2019 12:23 AM

## 2019-01-20 NOTE — Patient Instructions (Signed)
Salpingectomy Salpingectomy, also called tubectomy, is the surgical removal of one of the fallopian tubes. The fallopian tubes are where eggs travel from the ovaries to the uterus. Removing one fallopian tube does not prevent you from becoming pregnant. It also does not cause problems with your menstrual periods. You may need a salpingectomy if you:  Have a fertilized egg that attaches to the fallopian tube (ectopic pregnancy), especially one that causes the tube to burst or tear (rupture).  Have an infected fallopian tube.  Have cancer of the fallopian tube or nearby organs.  Have had an ovary removed due to a cyst or tumor.  Have had your uterus removed. There are three different methods that can be used for a salpingectomy:  Open. This method involves making one large incision in your abdomen.  Laparoscopic. This method involves using a thin, lighted tube with a tiny camera on the end (laparoscope) to help perform the procedure. The laparoscope will allow your surgeon to make several small incisions in the abdomen instead of a large incision.  Robot-assisted: This method involves using a computer to control surgical instruments that are attached to robotic arms. Tell a health care provider about:  Any allergies you have.  All medicines you are taking, including vitamins, herbs, eye drops, creams, and over-the-counter medicines.  Any problems you or family members have had with anesthetic medicines.  Any blood disorders you have.  Any surgeries you have had.  Any medical conditions you have.  Whether you are pregnant or may be pregnant. What are the risks? Generally, this is a safe procedure. However, problems may occur, including:  Infection.  Bleeding.  Allergic reactions to medicines.  Damage to other structures or organs.  Blood clots in the legs or lungs. What happens before the procedure? Staying hydrated Follow instructions from your health care provider about  hydration, which may include:  Up to 2 hours before the procedure - you may continue to drink clear liquids, such as water, clear fruit juice, black coffee, and plain tea. Eating and drinking restrictions Follow instructions from your health care provider about eating and drinking, which may include:  8 hours before the procedure - stop eating heavy meals or foods such as meat, fried foods, or fatty foods.  6 hours before the procedure - stop eating light meals or foods, such as toast or cereal.  6 hours before the procedure - stop drinking milk or drinks that contain milk.  2 hours before the procedure - stop drinking clear liquids. Medicines  Ask your health care provider about: ? Changing or stopping your regular medicines. This is especially important if you are taking diabetes medicines or blood thinners. ? Taking medicines such as aspirin and ibuprofen. These medicines can thin your blood. Do not take these medicines before your procedure if your health care provider instructs you not to.  You may be given antibiotic medicine to help prevent infection. General instructions  Do not smoke for at least 2 weeks before your procedure. If you need help quitting, ask your health care provider.  You may have an exam or tests, such as an electrocardiogram (ECG).  You may have a blood or urine sample taken.  Ask your health care provider: ? Whether you should stop removing hair from your surgical area. ? How your surgical site will be marked or identified.  You may be asked to shower with a germ-killing soap.  Plan to have someone take you home from the hospital or clinic.  If you will be going home right after the procedure, plan to have someone with you for 24 hours. What happens during the procedure?  To reduce your risk of infection: ? Your health care team will wash or sanitize their hands. ? Hair may be removed from the surgical area. ? Your skin will be washed with soap.   An IV tube will be inserted into one of your veins.  You will be given a medicine to make you fall asleep (general anesthetic). You may also be given a medicine to help you relax (sedative).  A thin tube (catheter) may be inserted through your urethra and into your bladder to drain urine during your procedure.  Depending on the type of procedure you are having, one incision or several small incisions will be made in your abdomen.  Your fallopian tube will be cut and removed from where it attaches to your uterus.  Your blood vessels will be clamped and tied to prevent excess bleeding.  The incision(s) in your abdomen will be closed with stitches (sutures), staples, or skin glue.  A bandage (dressing) may be placed over your incision(s). The procedure may vary among health care providers and hospitals. What happens after the procedure?   Your blood pressure, heart rate, breathing rate, and blood oxygen level will be monitored until the medicines you were given have worn off.  You may continue to receive fluids and medicines through an IV tube.  You may continue to have a catheter draining your urine.  You may have to wear compression stockings. These stockings help to prevent blood clots and reduce swelling in your legs.  You will be given pain medicine as needed.  Do not drive for 24 hours if you received a sedative. Summary  Salpingectomy is a surgical procedure to remove one of the fallopian tubes.  The procedure may be done with an open incision, with a laparoscope, or with computer-controlled instruments.  Depending on the type of procedure you are having, one incision or several small incisions will be made in your abdomen.  Your blood pressure, heart rate, breathing rate, and blood oxygen level will be monitored until the medicines you were given have worn off.  Plan to have someone take you home from the hospital or clinic. This information is not intended to replace  advice given to you by your health care provider. Make sure you discuss any questions you have with your health care provider. Document Released: 12/07/2008 Document Revised: 03/07/2016 Document Reviewed: 01/12/2013 Elsevier Interactive Patient Education  2019 [employer] Inc[employer]     Dilation and Curettage or Vacuum Curettage  Dilation and curettage (D&C) and vacuum curettage are minor procedures. A D&C involves stretching (dilation) the cervix and scraping (curettage) the inside lining of the uterus (endometrium). During a D&C, tissue is gently scraped from the endometrium, starting from the top portion of the uterus down to the lowest part of the uterus (cervix). During a vacuum curettage, the lining and tissue in the uterus are removed with the use of gentle suction. Curettage may be performed to either diagnose or treat a problem. As a diagnostic procedure, curettage is performed to examine tissues from the uterus. A diagnostic curettage may be done if you have:  Irregular bleeding in the uterus.  Bleeding with the development of clots.  Spotting between menstrual periods.  Prolonged menstrual periods or other abnormal bleeding.  Bleeding after menopause.  No menstrual period (amenorrhea).  A change in size and shape of the  uterus.  Abnormal endometrial cells discovered during a Pap test. As a treatment procedure, curettage may be performed for the following reasons:  Removal of an IUD (intrauterine device).  Removal of retained placenta after giving birth.  Abortion.  Miscarriage.  Removal of endometrial polyps.  Removal of uncommon types of noncancerous lumps (fibroids). Tell a health care provider about:  Any allergies you have, including allergies to prescribed medicine or latex.  All medicines you are taking, including vitamins, herbs, eye drops, creams, and over-the-counter medicines. This is especially important if you take any blood-thinning medicine. Bring a list  of all of your medicines to your appointment.  Any problems you or family members have had with anesthetic medicines.  Any blood disorders you have.  Any surgeries you have had.  Your medical history and any medical conditions you have.  Whether you are pregnant or may be pregnant.  Recent vaginal infections you have had.  Recent menstrual periods, bleeding problems you have had, and what form of birth control (contraception) you use. What are the risks? Generally, this is a safe procedure. However, problems may occur, including:  Infection.  Heavy vaginal bleeding.  Allergic reactions to medicines.  Damage to the cervix or other structures or organs.  Development of scar tissue (adhesions) inside the uterus, which can cause abnormal amounts of menstrual bleeding. This may make it harder to get pregnant in the future.  A hole (perforation) or puncture in the uterine wall. This is rare. What happens before the procedure? Staying hydrated Follow instructions from your health care provider about hydration, which may include:  Up to 2 hours before the procedure - you may continue to drink clear liquids, such as water, clear fruit juice, black coffee, and plain tea. Eating and drinking restrictions Follow instructions from your health care provider about eating and drinking, which may include:  8 hours before the procedure - stop eating heavy meals or foods such as meat, fried foods, or fatty foods.  6 hours before the procedure - stop eating light meals or foods, such as toast or cereal.  6 hours before the procedure - stop drinking milk or drinks that contain milk.  2 hours before the procedure - stop drinking clear liquids. If your health care provider told you to take your medicine(s) on the day of your procedure, take them with only a sip of water. Medicines  Ask your health care provider about: ? Changing or stopping your regular medicines. This is especially important  if you are taking diabetes medicines or blood thinners. ? Taking medicines such as aspirin and ibuprofen. These medicines can thin your blood. Do not take these medicines before your procedure if your health care provider instructs you not to.  You may be given antibiotic medicine to help prevent infection. General instructions  For 24 hours before your procedure, do not: ? Douche. ? Use tampons. ? Use medicines, creams, or suppositories in the vagina. ? Have sexual intercourse.  You may be given a pregnancy test on the day of the procedure.  Plan to have someone take you home from the hospital or clinic.  You may have a blood or urine sample taken.  If you will be going home right after the procedure, plan to have someone with you for 24 hours. What happens during the procedure?  To reduce your risk of infection: ? Your health care team will wash or sanitize their hands. ? Your skin will be washed with soap.  An IV  tube will be inserted into one of your veins.  You will be given one of the following: ? A medicine that numbs the area in and around the cervix (local anesthetic). ? A medicine to make you fall asleep (general anesthetic).  You will lie down on your back, with your feet in foot rests (stirrups).  The size and position of your uterus will be checked.  A lubricated instrument (speculum or Sims retractor) will be inserted into the back side of your vagina. The speculum will be used to hold apart the walls of your vagina so your health care provider can see your cervix.  A tool (tenaculum) will be attached to the lip of the cervix to stabilize it.  Your cervix will be softened and dilated. This may be done by: ? Taking a medicine. ? Having tapered dilators or thin rods (laminaria) or gradual widening instruments (tapered dilators) inserted into your cervix.  A small, sharp, curved instrument (curette) will be used to scrape a small amount of tissue or cells from  the endometrium or cervical canal. In some cases, gentle suction is applied with the curette. The curette will then be removed. The cells will be taken to a lab for testing. The procedure may vary among health care providers and hospitals. What happens after the procedure?  You may have mild cramping, backache, pain, and light bleeding or spotting. You may pass small blood clots from your vagina.  You may have to wear compression stockings. These stockings help to prevent blood clots and reduce swelling in your legs.  Your blood pressure, heart rate, breathing rate, and blood oxygen level will be monitored until the medicines you were given have worn off. Summary  Dilation and curettage (D&C) involves stretching (dilation) the cervix and scraping (curettage) the inside lining of the uterus (endometrium).  After the procedure, you may have mild cramping, backache, pain, and light bleeding or spotting. You may pass small blood clots from your vagina.  Plan to have someone take you home from the hospital or clinic. This information is not intended to replace advice given to you by your health care provider. Make sure you discuss any questions you have with your health care provider. Document Released: 07/21/2005 Document Revised: 04/06/2016 Document Reviewed: 04/06/2016 Elsevier Interactive Patient Education  2019 [employer].

## 2019-01-20 NOTE — Progress Notes (Signed)
Pt is present today due to having abnormal bleeding. Pt stated that she was having a lot of clots and her cycle is really heavy.

## 2019-02-14 ENCOUNTER — Telehealth: Payer: Self-pay | Admitting: Obstetrics and Gynecology

## 2019-02-14 NOTE — Telephone Encounter (Signed)
The patient called and stated that she needs to speak with Martin Majestic in regards to confirming the date of her surgery. Pt stated if she does not answer the phone to leave voicemail. Please advise.

## 2019-02-16 NOTE — Telephone Encounter (Signed)
Sent Dr Marcelline Mates a message regarding scheduling surgery for patient and a possible date. I have not received anything to get the patient scheduled. Spoke with patient this am, advised I will call her back as soon as I hear from Dr Marcelline Mates.-[facility]

## 2019-02-17 ENCOUNTER — Telehealth: Payer: Self-pay

## 2019-02-17 NOTE — Telephone Encounter (Signed)
Spoke with pt concerning making an appointment for pre-op. Pt made an appointment.

## 2019-02-22 ENCOUNTER — Ambulatory Visit (INDEPENDENT_AMBULATORY_CARE_PROVIDER_SITE_OTHER): Payer: BC Managed Care – PPO | Admitting: Obstetrics and Gynecology

## 2019-02-22 ENCOUNTER — Encounter: Payer: Self-pay | Admitting: Obstetrics and Gynecology

## 2019-02-22 ENCOUNTER — Other Ambulatory Visit: Payer: Self-pay

## 2019-02-22 ENCOUNTER — Other Ambulatory Visit
Admission: RE | Admit: 2019-02-22 | Discharge: 2019-02-22 | Disposition: A | Discharge status: Home / Self Care | Payer: BC Managed Care – PPO | Source: Ambulatory Visit | Attending: Obstetrics and Gynecology | Admitting: Obstetrics and Gynecology

## 2019-02-22 VITALS — BP 138/77 | HR 91 | Ht 60.0 in | Wt 158.8 lb

## 2019-02-22 DIAGNOSIS — Z9851 Tubal ligation status: Secondary | ICD-10-CM

## 2019-02-22 DIAGNOSIS — R102 Pelvic and perineal pain unspecified side: Secondary | ICD-10-CM

## 2019-02-22 DIAGNOSIS — N921 Excessive and frequent menstruation with irregular cycle: Secondary | ICD-10-CM

## 2019-02-22 DIAGNOSIS — Z1159 Encounter for screening for other viral diseases: Secondary | ICD-10-CM | POA: Insufficient documentation

## 2019-02-22 DIAGNOSIS — N9989 Other postprocedural complications and disorders of genitourinary system: Secondary | ICD-10-CM

## 2019-02-22 DIAGNOSIS — D500 Iron deficiency anemia secondary to blood loss (chronic): Secondary | ICD-10-CM

## 2019-02-22 DIAGNOSIS — N946 Dysmenorrhea, unspecified: Secondary | ICD-10-CM

## 2019-02-22 LAB — SARS CORONAVIRUS 2 (TAT 6-24 HRS): SARS Coronavirus 2: NEGATIVE

## 2019-02-22 NOTE — H&P (View-Only) (Signed)
GYNECOLOGY PREOPERATIVE HISTORY AND PHYSICAL   Subjective:  Donna Perry is a 43 y.o. G1P1001 here for surgical management of pelvic pain s/p tubal ligation with Essure coils, menometrorrhagia, dysmenorrhea, and anemia.  No significant preoperative concerns.   Proposed surgery: Laparoscopic bilateral salpingectomy, Dilation and Curettage   Pertinent Gynecological History: Menses: flow is moderate and irregular occurring approximately every 14-20 days without intermenstrual spotting Bleeding: dysfunctional uterine bleeding Contraception: tubal ligation Last pap: normal Date: 01/2017   Past Medical History:  Diagnosis Date  . Hypertension   . Seasonal allergies     Past Surgical History:  Procedure Laterality Date  . ESSURE TUBAL LIGATION    . LEEP    . LEG SURGERY Left     OB History  Gravida Para Term Preterm AB Living  1 1 1     1   SAB TAB Ectopic Multiple Live Births          1    # Outcome Date GA Lbr Len/2nd Weight Sex Delivery Anes PTL Lv  1 Term     M Vag-Spont   LIV     Family History  Problem Relation Age of Onset  . Cervical cancer Mother   . Hypertension Mother   . Lung cancer Father   . Breast cancer Neg Hx   . Heart failure Neg Hx   . Seizures Neg Hx     Social History   Socioeconomic History  . Marital status: Single    Spouse name: Not on file  . Number of children: Not on file  . Years of education: Not on file  . Highest education level: Not on file  Occupational History  . Not on file  Social Needs  . Financial resource strain: Not on file  . Food insecurity    Worry: Not on file    Inability: Not on file  . Transportation needs    Medical: Not on file    Non-medical: Not on file  Tobacco Use  . Smoking status: Never Smoker  . Smokeless tobacco: Never Used  Substance and Sexual Activity  . Alcohol use: Yes    Comment: Socially  . Drug use: No  . Sexual activity: Not Currently    Birth control/protection: Surgical   Lifestyle  . Physical activity    Days per week: Not on file    Minutes per session: Not on file  . Stress: Not on file  Relationships  . Social Herbalist on phone: Not on file    Gets together: Not on file    Attends religious service: Not on file    Active member of club or organization: Not on file    Attends meetings of clubs or organizations: Not on file    Relationship status: Not on file  . Intimate partner violence    Fear of current or ex partner: Not on file    Emotionally abused: Not on file    Physically abused: Not on file    Forced sexual activity: Not on file  Other Topics Concern  . Not on file  Social History Narrative  . Not on file    Current Outpatient Medications on File Prior to Visit  Medication Sig Dispense Refill  . acidophilus (RISAQUAD) CAPS capsule Take 1 capsule by mouth 2 (two) times a day.     Marland Kitchen CALCIUM-VITAMIN D PO Take 1 tablet by mouth daily.     Marland Kitchen docusate sodium (COLACE) 100 MG capsule  Take 1 capsule (100 mg total) by mouth 2 (two) times daily as needed. 30 capsule 2  . EPINEPHrine (EPIPEN 2-PAK) 0.3 mg/0.3 mL IJ SOAJ injection Inject 0.3 mg into the muscle as needed for anaphylaxis.     . ferrous sulfate 325 (65 FE) MG EC tablet Take 325 mg by mouth 2 (two) times a day.     . fexofenadine (ALLEGRA) 180 MG tablet Take 180 mg by mouth.    . fluticasone (FLONASE) 50 MCG/ACT nasal spray Place 1 spray into both nostrils daily as needed for allergies.     . hydrochlorothiazide (HYDRODIURIL) 25 MG tablet Take 25 mg by mouth daily.     Marland Kitchen ibuprofen (ADVIL,MOTRIN) 200 MG tablet Take 800 mg by mouth every 8 (eight) hours as needed (pain).     Marland Kitchen levocetirizine (XYZAL) 5 MG tablet Take 5 mg by mouth every evening.    Marland Kitchen lisinopril (ZESTRIL) 5 MG tablet Take 5 mg by mouth daily.    Marland Kitchen loratadine (CLARITIN) 10 MG tablet Take 10 mg by mouth daily.    Marland Kitchen nystatin (MYCOSTATIN/NYSTOP) powder Apply 1 Bottle topically 2 (two) times daily as needed  (IRRITATION).    . Potassium 99 MG TABS Take 99 mg by mouth daily as needed (POTASSIUM LEVELS).    . Turmeric 500 MG CAPS Take 500 mg by mouth daily as needed (PT PREF).      No current facility-administered medications on file prior to visit.     Allergies  Allergen Reactions  . Sulfamethoxazole-Trimethoprim Hives  . Sulfur      Review of Systems Constitutional: No recent fever/chills/sweats Respiratory: No recent cough/bronchitis Cardiovascular: No chest pain Gastrointestinal: No recent nausea/vomiting/diarrhea Genitourinary: No UTI symptoms Hematologic/lymphatic:No history of coagulopathy or recent blood thinner use     Objective:   Blood pressure 138/77, pulse 91, height 5' (1.524 m), weight 158 lb 12.8 oz (72 kg), last menstrual period 02/12/2019. CONSTITUTIONAL: Well-developed, well-nourished female in no acute distress.  HENT:  Normocephalic, atraumatic, External right and left ear normal. Oropharynx is clear and moist EYES: Conjunctivae and EOM are normal. Pupils are equal, round, and reactive to light. No scleral icterus.  NECK: Normal range of motion, supple, no masses SKIN: Skin is warm and dry. No rash noted. Not diaphoretic. No erythema. No pallor. NEUROLOGIC: Alert and oriented to person, place, and time. Normal reflexes, muscle tone coordination. No cranial nerve deficit noted. PSYCHIATRIC: Normal mood and affect. Normal behavior. Normal judgment and thought content. CARDIOVASCULAR: Normal heart rate noted, regular rhythm RESPIRATORY: Effort and breath sounds normal, no problems with respiration noted ABDOMEN: Soft, nontender, nondistended. PELVIC: Deferred MUSCULOSKELETAL: Normal range of motion. No edema and no tenderness. 2+ distal pulses.    Labs: Results for orders placed or performed during the hospital encounter of 02/22/19 (from the past 336 hour(s))  SARS Coronavirus 2 (Performed in [place] hospital lab)   Collection Time: 02/22/19 10:19 AM    Specimen: Nasal Swab  Result Value Ref Range   SARS Coronavirus 2 NEGATIVE NEGATIVE    Lab Results  Component Value Date   WBC 6.8 01/19/2019   HGB 8.7 (L) 01/19/2019   HCT 27.6 (L) 01/19/2019   MCV 75 (L) 01/19/2019   PLT 446 01/19/2019     Imaging Studies: US Transvaginal Non-OB ULTRASOUND REPORT  Location: [facility] Date of Service: 02/03/17  Indications:AUB Findings:  The uterus measures . Echo texture is heterogenous with evidence of focal mass. Within the uterus is a suspected fibroid  measuring: Fibroid 1: left fundal intramural 1.4 x 1.9 x 1.4 cm.  The Endometrium measures 7.3 mm.  Right Ovary measures 2.8 x 2 x 1.5 cm. It is normal in appearance. Left Ovary measures 2.5 x 1.5 x 1.3 cm. It is normal appearance. Survey of the adnexa demonstrates no adnexal masses. There is no free fluid in the cul de sac.  Impression: 1. fibroid  Recommendations: 1.Clinical correlation with the patient's History and Physical Exam.  Tyrone Apple  I have reviewed this study and agree with documented findings.   Rubie Maid, MD [facility]   Assessment:    Pelvic pain  History of tubal ligation  Menometrorrhagia  Fibroid uterus Dysmenorrhea  Plan:   Patient desires surgical management with laparoscopic bilateral salpingectomy (with Essure coil removal) and Dilation and Curettage.  The risks of surgery were discussed in detail with the patient including but not limited to: bleeding which may require transfusion or reoperation; infection which may require prolonged hospitalization or re-hospitalization and antibiotic therapy; injury to bowel, bladder, ureters and major vessels or other surrounding organs; need for additional procedures including laparotomy; thromboembolic phenomenon, incisional problems and other postoperative or anesthesia complications.  Patient was told that the likelihood that her condition and symptoms will be treated  effectively with this surgical management was very high; the postoperative expectations were also discussed in detail. The patient also understands the alternative treatment options which were discussed in full. All questions were answered.  She was told that she will be contacted by the surgical scheduler regarding the time and date of her surgery; routine preoperative instructions will be given to her by the preoperative nursing team.   She is aware of need for preoperative COVID testing and subsequent quarantine from time of test to time of surgery. Surgery scheduled for 02/25/2019.  - Fibroid uterus, last noted to be 1 cm. Not concerned that this could be a cause of her abnormal bleeding due to size.  - Menometrorrhagia. Has had negative workup with pap and endometrial biopsy in December. Recently treated with a round of Provera. After surgery, can discuss continuation of progesterone for longer term therapy.  - Anemia due to menometrorrhagia. Increased iron tablets last visit to twice daily.  Took Provera for menometrorrhagia last month which did help to decrease clotting and flow of last cycle.   - Instructions reviewed, including NPO after midnight.     Rubie Maid, MD [facility]

## 2019-02-22 NOTE — H&P (Signed)
GYNECOLOGY PREOPERATIVE HISTORY AND PHYSICAL   Subjective:  Donna Perry is a 43 y.o. G1P1001 here for surgical management of pelvic pain s/p tubal ligation with Essure coils, menometrorrhagia, dysmenorrhea, and anemia.  No significant preoperative concerns.   Proposed surgery: Laparoscopic bilateral salpingectomy, Dilation and Curettage   Pertinent Gynecological History: Menses: flow is moderate and irregular occurring approximately every 14-20 days without intermenstrual spotting Bleeding: dysfunctional uterine bleeding Contraception: tubal ligation Last pap: normal Date: 01/2017   Past Medical History:  Diagnosis Date  . Hypertension   . Seasonal allergies     Past Surgical History:  Procedure Laterality Date  . ESSURE TUBAL LIGATION    . LEEP    . LEG SURGERY Left     OB History  Gravida Para Term Preterm AB Living  1 1 1     1   SAB TAB Ectopic Multiple Live Births          1    # Outcome Date GA Lbr Len/2nd Weight Sex Delivery Anes PTL Lv  1 Term     M Vag-Spont   LIV     Family History  Problem Relation Age of Onset  . Cervical cancer Mother   . Hypertension Mother   . Lung cancer Father   . Breast cancer Neg Hx   . Heart failure Neg Hx   . Seizures Neg Hx     Social History   Socioeconomic History  . Marital status: Single    Spouse name: Not on file  . Number of children: Not on file  . Years of education: Not on file  . Highest education level: Not on file  Occupational History  . Not on file  Social Needs  . Financial resource strain: Not on file  . Food insecurity    Worry: Not on file    Inability: Not on file  . Transportation needs    Medical: Not on file    Non-medical: Not on file  Tobacco Use  . Smoking status: Never Smoker  . Smokeless tobacco: Never Used  Substance and Sexual Activity  . Alcohol use: Yes    Comment: Socially  . Drug use: No  . Sexual activity: Not Currently    Birth control/protection: Surgical   Lifestyle  . Physical activity    Days per week: Not on file    Minutes per session: Not on file  . Stress: Not on file  Relationships  . Social Herbalist on phone: Not on file    Gets together: Not on file    Attends religious service: Not on file    Active member of club or organization: Not on file    Attends meetings of clubs or organizations: Not on file    Relationship status: Not on file  . Intimate partner violence    Fear of current or ex partner: Not on file    Emotionally abused: Not on file    Physically abused: Not on file    Forced sexual activity: Not on file  Other Topics Concern  . Not on file  Social History Narrative  . Not on file    Current Outpatient Medications on File Prior to Visit  Medication Sig Dispense Refill  . acidophilus (RISAQUAD) CAPS capsule Take 1 capsule by mouth 2 (two) times a day.     Marland Kitchen CALCIUM-VITAMIN D PO Take 1 tablet by mouth daily.     Marland Kitchen docusate sodium (COLACE) 100 MG capsule  Take 1 capsule (100 mg total) by mouth 2 (two) times daily as needed. 30 capsule 2  . EPINEPHrine (EPIPEN 2-PAK) 0.3 mg/0.3 mL IJ SOAJ injection Inject 0.3 mg into the muscle as needed for anaphylaxis.     . ferrous sulfate 325 (65 FE) MG EC tablet Take 325 mg by mouth 2 (two) times a day.     . fexofenadine (ALLEGRA) 180 MG tablet Take 180 mg by mouth.    . fluticasone (FLONASE) 50 MCG/ACT nasal spray Place 1 spray into both nostrils daily as needed for allergies.     . hydrochlorothiazide (HYDRODIURIL) 25 MG tablet Take 25 mg by mouth daily.     Marland Kitchen ibuprofen (ADVIL,MOTRIN) 200 MG tablet Take 800 mg by mouth every 8 (eight) hours as needed (pain).     Marland Kitchen levocetirizine (XYZAL) 5 MG tablet Take 5 mg by mouth every evening.    Marland Kitchen lisinopril (ZESTRIL) 5 MG tablet Take 5 mg by mouth daily.    Marland Kitchen loratadine (CLARITIN) 10 MG tablet Take 10 mg by mouth daily.    Marland Kitchen nystatin (MYCOSTATIN/NYSTOP) powder Apply 1 Bottle topically 2 (two) times daily as needed  (IRRITATION).    . Potassium 99 MG TABS Take 99 mg by mouth daily as needed (POTASSIUM LEVELS).    . Turmeric 500 MG CAPS Take 500 mg by mouth daily as needed (PT PREF).      No current facility-administered medications on file prior to visit.     Allergies  Allergen Reactions  . Sulfamethoxazole-Trimethoprim Hives  . Sulfur      Review of Systems Constitutional: No recent fever/chills/sweats Respiratory: No recent cough/bronchitis Cardiovascular: No chest pain Gastrointestinal: No recent nausea/vomiting/diarrhea Genitourinary: No UTI symptoms Hematologic/lymphatic:No history of coagulopathy or recent blood thinner use     Objective:   Blood pressure 138/77, pulse 91, height 5' (1.524 m), weight 158 lb 12.8 oz (72 kg), last menstrual period 02/12/2019. CONSTITUTIONAL: Well-developed, well-nourished female in no acute distress.  HENT:  Normocephalic, atraumatic, External right and left ear normal. Oropharynx is clear and moist EYES: Conjunctivae and EOM are normal. Pupils are equal, round, and reactive to light. No scleral icterus.  NECK: Normal range of motion, supple, no masses SKIN: Skin is warm and dry. No rash noted. Not diaphoretic. No erythema. No pallor. NEUROLOGIC: Alert and oriented to person, place, and time. Normal reflexes, muscle tone coordination. No cranial nerve deficit noted. PSYCHIATRIC: Normal mood and affect. Normal behavior. Normal judgment and thought content. CARDIOVASCULAR: Normal heart rate noted, regular rhythm RESPIRATORY: Effort and breath sounds normal, no problems with respiration noted ABDOMEN: Soft, nontender, nondistended. PELVIC: Deferred MUSCULOSKELETAL: Normal range of motion. No edema and no tenderness. 2+ distal pulses.    Labs: Results for orders placed or performed during the hospital encounter of 02/22/19 (from the past 336 hour(s))  SARS Coronavirus 2 (Performed in [place] hospital lab)   Collection Time: 02/22/19 10:19 AM    Specimen: Nasal Swab  Result Value Ref Range   SARS Coronavirus 2 NEGATIVE NEGATIVE    Lab Results  Component Value Date   WBC 6.8 01/19/2019   HGB 8.7 (L) 01/19/2019   HCT 27.6 (L) 01/19/2019   MCV 75 (L) 01/19/2019   PLT 446 01/19/2019     Imaging Studies: US Transvaginal Non-OB ULTRASOUND REPORT  Location: [facility] Date of Service: 02/03/17  Indications:AUB Findings:  The uterus measures . Echo texture is heterogenous with evidence of focal mass. Within the uterus is a suspected fibroid  measuring: Fibroid 1: left fundal intramural 1.4 x 1.9 x 1.4 cm.  The Endometrium measures 7.3 mm.  Right Ovary measures 2.8 x 2 x 1.5 cm. It is normal in appearance. Left Ovary measures 2.5 x 1.5 x 1.3 cm. It is normal appearance. Survey of the adnexa demonstrates no adnexal masses. There is no free fluid in the cul de sac.  Impression: 1. fibroid  Recommendations: 1.Clinical correlation with the patient's History and Physical Exam.  Tyrone Apple  I have reviewed this study and agree with documented findings.   Rubie Maid, MD [facility]   Assessment:    Pelvic pain  History of tubal ligation  Menometrorrhagia  Fibroid uterus Dysmenorrhea  Plan:   Patient desires surgical management with laparoscopic bilateral salpingectomy (with Essure coil removal) and Dilation and Curettage.  The risks of surgery were discussed in detail with the patient including but not limited to: bleeding which may require transfusion or reoperation; infection which may require prolonged hospitalization or re-hospitalization and antibiotic therapy; injury to bowel, bladder, ureters and major vessels or other surrounding organs; need for additional procedures including laparotomy; thromboembolic phenomenon, incisional problems and other postoperative or anesthesia complications.  Patient was told that the likelihood that her condition and symptoms will be treated  effectively with this surgical management was very high; the postoperative expectations were also discussed in detail. The patient also understands the alternative treatment options which were discussed in full. All questions were answered.  She was told that she will be contacted by the surgical scheduler regarding the time and date of her surgery; routine preoperative instructions will be given to her by the preoperative nursing team.   She is aware of need for preoperative COVID testing and subsequent quarantine from time of test to time of surgery. Surgery scheduled for 02/25/2019.  - Fibroid uterus, last noted to be 1 cm. Not concerned that this could be a cause of her abnormal bleeding due to size.  - Menometrorrhagia. Has had negative workup with pap and endometrial biopsy in December. Recently treated with a round of Provera. After surgery, can discuss continuation of progesterone for longer term therapy.  - Anemia due to menometrorrhagia. Increased iron tablets last visit to twice daily.  Took Provera for menometrorrhagia last month which did help to decrease clotting and flow of last cycle.   - Instructions reviewed, including NPO after midnight.     Rubie Maid, MD [facility]

## 2019-02-22 NOTE — Patient Instructions (Signed)
Preparing for Surgery Preparing for surgery is an important part of your care. It can make things go more smoothly and help you avoid complications. The steps leading up to surgery may vary among hospitals. Follow all instructions given to you by your health care providers. Ask questions if you do not understand something. Talk about any concerns that you have. Here are some questions to consider asking before your surgery:  If my surgery is not an emergency (is elective), when would be the best time to have the surgery?  What arrangements do I need to make for work, home, or school?  What will my recovery be like? How long will it be before I can return to normal activities?  Will I need to prepare my home? Will I need to arrange care for me or my children?  Should I expect to have pain after surgery? What are my pain management options? Are there nonmedical options that I can try for pain? Tell a health care provider about:   Any allergies you have.  All medicines you are taking, including vitamins, herbs, eye drops, creams, and over-the-counter medicines.  Any problems you or family members have had with anesthetic medicines.  Any blood disorders you have.  Any surgeries you have had.  Any medical conditions you have.  Whether you are pregnant or may be pregnant. What are the risks? The risks and complications of surgery depend on the specific procedure that you have. Discuss all the risks with your health care providers before your surgery. Ask about common surgical complications, which may include:  Infection.  Bleeding or a need for blood replacement (transfusion).  Allergic reactions to medicines.  Damage to surrounding nerves, tissues, or structures.  A blood clot.  Scarring.  Failure of the surgery to correct the problem. Follow these instructions before the procedure: Several days or weeks before your procedure  You may have a physical exam by your primary  health care provider to make sure it is safe for you to have surgery.  You may have testing. This may include a chest X-ray, blood and urine tests, electrocardiogram (ECG), or other testing.  Ask your health care provider about: ? Changing or stopping your regular medicines. This is especially important if you are taking diabetes medicines or blood thinners. ? Taking medicines such as aspirin and ibuprofen. These medicines can thin your blood. Do not take these medicines unless your health care provider tells you to take them. ? Taking over-the-counter medicines, vitamins, herbs, and supplements.  Do not use any products that contain nicotine or tobacco, such as cigarettes and e-cigarettes. If you need help quitting, ask your health care provider.  Avoid alcohol, or limit your alcohol intake to no more than 1 drink a day for nonpregnant women and 2 drinks a day for men. One drink equals 12 oz of beer, 5 oz of wine, or 1 oz of hard liquor.  Ask your health care provider if there are exercises you can do to prepare for surgery.  Eat a healthy diet. A healthy diet includes low-fat dairy products, low-fat (lean) meats, and fiber from whole grains, beans, and lots of fruits and vegetables.  Plan to have someone take you home from the hospital or clinic.  Plan to have a responsible adult care for you for at least 24 hours after you leave the hospital or clinic. This is important. The day before your procedure  You may be given antibiotic medicine to take by mouth to  help prevent infection. Take it as told by your health care provider.  You may be asked to shower with a germ-killing soap.  Follow instructions from your health care provider about eating and drinking restrictions. The day of your procedure  You may need to take another shower with a germ-killing soap before you leave home in the morning.  With a small sip of water, take only the medicines that you are told to take.  Do not  wear any makeup, nail polish, powder, deodorant, lotion, jewelry, hair accessories, or anything on your skin or body except your clothes.  If you will be staying in the hospital, bring a case to hold your glasses, contacts, or dentures. You may also want to bring your robe and non-skid footwear.  If instructed by your health care provider, bring your sleep apnea device with you on the day of your surgery (if this applies to you).  Arrive at the hospital as scheduled.  Bring a friend or family member with you who can help to answer questions and be present while you meet with your health care provider. At the hospital  When you arrive at the hospital, you will likely: ? Go to the reception desk to notify staff that you have arrived. ? Move to the preoperative and preparation areas before going to the operating room.  You may have to wear compression stockings. These help to prevent blood clots and reduce swelling in your legs.  An IV may be inserted into one of your veins.  In the operating room, you may be given one or more of the following: ? A medicine to help you relax (sedative). ? A medicine to numb the area (local anesthetic). ? A medicine to make you fall asleep (general anesthetic). ? A medicine that is injected into an area of your body to numb everything below the injection site (regional anesthetic).  Your surgical site will be marked or identified.  You may be given an antibiotic through your IV to help prevent infection. Contact a health care provider if you:  Develop a fever of more than 100.63F (38C) or other feelings of illness during the 48 hours before your surgery.  Have symptoms that get worse.  Have questions or concerns about your surgery. Summary  Preparing for surgery can make the procedure go more smoothly and lower your risk of complications.  Before surgery, make a list of questions and concerns to discuss with your surgeon. Ask about the risks and  possible complications.  In the days or weeks before your surgery, follow all instructions from your health care provider. You may need to stop smoking, avoid alcohol, follow eating restrictions, and change or stop your regular medicines.  Contact your surgeon if you develop a fever or other signs of illness during the few days before your surgery. This information is not intended to replace advice given to you by your health care provider. Make sure you discuss any questions you have with your health care provider. Document Released: 05/26/2017 Document Revised: 07/24/2017 Document Reviewed: 05/26/2017 Elsevier Patient Education  2020 [employer].

## 2019-02-22 NOTE — Progress Notes (Signed)
Pt is present for pre-op exam.  Pt stated that she was doing well no problems.

## 2019-02-22 NOTE — Progress Notes (Signed)
    GYNECOLOGY PROGRESS NOTE  Subjective:    Patient ID: Donna Perry, female    DOB: [birth year], 42 y.o.   MRN: [id number]  HPI  Patient is a 43 y.o. G16P1001 female who presents for pre-operative examination. Is planning to have a bilateral salpingectomy with Essure coil removal due to pelvic pain.  Also is having some abnormal uterine bleeding, recently being treated with a course of Provera.  Notes periods were lighter, but still lasted 10 days. Desires to also have D&C performed with surgery.    The following portions of the patient's history were reviewed and updated as appropriate: allergies, current medications, past family history, past medical history, past social history, past surgical history and problem list.  Review of Systems Pertinent items noted in HPI and remainder of comprehensive ROS otherwise negative.   Objective:   Blood pressure 138/77, pulse 91, height 5' (1.524 m), weight 158 lb 12.8 oz (72 kg), last menstrual period 02/12/2019. General appearance: alert and no distress Abdomen: soft, non-tender; bowel sounds normal; no masses,  no organomegaly Pelvic: deferred See H&P for detailed exam.    Assessment:   Pelvic pain Post-tubal ligation syndrome Menometrorrhagia History of anemia Dysmenorrhea Fibroid uterus  Plan:   Patient desires surgical management with laparoscopic bilateral salpingectomy (with Essure coil removal) and Dilation and Curettage.  The risks of surgery were discussed in detail with the patient including but not limited to: bleeding which may require transfusion or reoperation; infection which may require prolonged hospitalization or re-hospitalization and antibiotic therapy; injury to bowel, bladder, ureters and major vessels or other surrounding organs; need for additional procedures including laparotomy; thromboembolic phenomenon, incisional problems and other postoperative or anesthesia complications.  Patient was told that the likelihood that  her condition and symptoms will be treated effectively with this surgical management was very high; the postoperative expectations were also discussed in detail. The patient also understands the alternative treatment options which were discussed in full. All questions were answered.  She was told that she will be contacted by the surgical scheduler regarding the time and date of her surgery; routine preoperative instructions will be given to her by the preoperative nursing team.   She is aware of need for preoperative COVID testing and subsequent quarantine from time of test to time of surgery. Surgery scheduled for 02/25/2019.  - Fibroid uterus, last noted to be 1 cm. Not concerned that this could be a cause of her abnormal bleeding due to size.  - Menometrorrhagia. Has had negative workup with pap and endometrial biopsy in December. Recently treated with a round of Provera. After surgery, can discuss continuation of progesterone for longer term therapy.  - Anemia due to menometrorrhagia. Increased iron tablets last visit to twice daily.  Took Provera for menometrorrhagia last month which did help to decrease clotting and flow of last cycle.     Donna Maid, MD [facility]

## 2019-02-23 ENCOUNTER — Other Ambulatory Visit: Payer: Self-pay

## 2019-02-23 ENCOUNTER — Encounter
Admission: RE | Admit: 2019-02-23 | Discharge: 2019-02-23 | Disposition: A | Discharge status: Home / Self Care | Payer: BC Managed Care – PPO | Source: Ambulatory Visit | Attending: Obstetrics and Gynecology | Admitting: Obstetrics and Gynecology

## 2019-02-23 HISTORY — DX: Anemia, unspecified: D64.9

## 2019-02-23 NOTE — Patient Instructions (Signed)
Your procedure is scheduled on: Fri 02/25/19 Report to [place]. To find out your arrival time please call [phone] between 1PM - 3PM on Thur 02/24/19.  Remember: Instructions that are not followed completely may result in serious medical risk, up to and including death, or upon the discretion of your surgeon and anesthesiologist your surgery may need to be rescheduled.     _X__ 1. Do not eat food after midnight the night before your procedure.                 No gum chewing or hard candies. You may drink clear liquids up to 2 hours                 before you are scheduled to arrive for your surgery- DO not drink clear                 liquids within 2 hours of the start of your surgery.                 Clear Liquids include:  water, apple juice without pulp, clear carbohydrate                 drink such as Clearfast or Gatorade, Black Coffee or Tea (Do not add                 anything to coffee or tea). Diabetics water only  __X__2.  On the morning of surgery brush your teeth with toothpaste and water, you                 may rinse your mouth with mouthwash if you wish.  Do not swallow any              toothpaste of mouthwash.     _X__ 3.  No Alcohol for 24 hours before or after surgery.   _X__ 4.  Do Not Smoke or use e-cigarettes For 24 Hours Prior to Your Surgery.                 Do not use any chewable tobacco products for at least 6 hours prior to                 surgery.  ____  5.  Bring all medications with you on the day of surgery if instructed.   __X__  6.  Notify your doctor if there is any change in your medical condition      (cold, fever, infections).     Do not wear jewelry, make-up, hairpins, clips or nail polish. Do not wear lotions, powders, or perfumes.  Do not shave 48 hours prior to surgery. Men may shave face and neck. Do not bring valuables to the hospital.    [facility] is not responsible for any  belongings or valuables.  Contacts, dentures/partials or body piercings may not be worn into surgery. Bring a case for your contacts, glasses or hearing aids, a denture cup will be supplied. Leave your suitcase in the car. After surgery it may be brought to your room. For patients admitted to the hospital, discharge time is determined by your treatment team.   Patients discharged the day of surgery will not be allowed to drive home.   Please read over the following fact sheets that you were given:   MRSA Information  __X__ Take these medicines the morning of surgery with A SIP OF WATER:  1.   2.   3.   4.  5.  6.  ____ Fleet Enema (as directed)   __X__ Use CHG Soap/SAGE wipes as directed  ____ Use inhalers on the day of surgery  ____ Stop metformin/Janumet/Farxiga 2 days prior to surgery    ____ Take 1/2 of usual insulin dose the night before surgery. No insulin the morning          of surgery.   ____ Stop Blood Thinners Coumadin/Plavix/Xarelto/Pleta/Pradaxa/Eliquis/Effient/Aspirin  on   Or contact your Surgeon, Cardiologist or Medical Doctor regarding  ability to stop your blood thinners  __X__ Stop Anti-inflammatories 7 days before surgery such as Advil, Ibuprofen, Motrin,  BC or Goodies Powder, Naprosyn, Naproxen, Aleve, Aspirin    __X__ Stop all herbal supplements, fish oil or vitamin E until after surgery.    ____ Bring C-Pap to the hospital.      

## 2019-02-25 ENCOUNTER — Ambulatory Visit: Payer: BC Managed Care – PPO | Admitting: Anesthesiology

## 2019-02-25 ENCOUNTER — Other Ambulatory Visit: Payer: Self-pay

## 2019-02-25 ENCOUNTER — Encounter
Admission: RE | Disposition: A | Discharge status: Home / Self Care | Payer: Self-pay | Source: Home / Self Care | Attending: Obstetrics and Gynecology

## 2019-02-25 ENCOUNTER — Encounter: Payer: Self-pay | Admitting: *Deleted

## 2019-02-25 ENCOUNTER — Ambulatory Visit
Admission: RE | Admit: 2019-02-25 | Discharge: 2019-02-25 | Disposition: A | Discharge status: Home / Self Care | Payer: BC Managed Care – PPO | Attending: Obstetrics and Gynecology | Admitting: Obstetrics and Gynecology

## 2019-02-25 DIAGNOSIS — D500 Iron deficiency anemia secondary to blood loss (chronic): Secondary | ICD-10-CM

## 2019-02-25 DIAGNOSIS — Z9889 Other specified postprocedural states: Secondary | ICD-10-CM

## 2019-02-25 DIAGNOSIS — D251 Intramural leiomyoma of uterus: Secondary | ICD-10-CM | POA: Insufficient documentation

## 2019-02-25 DIAGNOSIS — J302 Other seasonal allergic rhinitis: Secondary | ICD-10-CM | POA: Insufficient documentation

## 2019-02-25 DIAGNOSIS — R1900 Intra-abdominal and pelvic swelling, mass and lump, unspecified site: Secondary | ICD-10-CM

## 2019-02-25 DIAGNOSIS — N921 Excessive and frequent menstruation with irregular cycle: Secondary | ICD-10-CM | POA: Diagnosis present

## 2019-02-25 DIAGNOSIS — N9989 Other postprocedural complications and disorders of genitourinary system: Secondary | ICD-10-CM

## 2019-02-25 DIAGNOSIS — I10 Essential (primary) hypertension: Secondary | ICD-10-CM | POA: Insufficient documentation

## 2019-02-25 DIAGNOSIS — N938 Other specified abnormal uterine and vaginal bleeding: Secondary | ICD-10-CM

## 2019-02-25 DIAGNOSIS — N946 Dysmenorrhea, unspecified: Secondary | ICD-10-CM | POA: Insufficient documentation

## 2019-02-25 DIAGNOSIS — N802 Endometriosis of fallopian tube: Secondary | ICD-10-CM | POA: Insufficient documentation

## 2019-02-25 DIAGNOSIS — Z79899 Other long term (current) drug therapy: Secondary | ICD-10-CM | POA: Insufficient documentation

## 2019-02-25 DIAGNOSIS — R102 Pelvic and perineal pain: Secondary | ICD-10-CM | POA: Diagnosis not present

## 2019-02-25 DIAGNOSIS — Z9851 Tubal ligation status: Secondary | ICD-10-CM | POA: Diagnosis not present

## 2019-02-25 DIAGNOSIS — Z9079 Acquired absence of other genital organ(s): Secondary | ICD-10-CM

## 2019-02-25 HISTORY — PX: HYSTEROSCOPY WITH D & C: SHX1775

## 2019-02-25 HISTORY — PX: LAPAROSCOPIC BILATERAL SALPINGECTOMY: SHX5889

## 2019-02-25 LAB — BASIC METABOLIC PANEL
Anion gap: 6 (ref 5–15)
BUN: 11 mg/dL (ref 6–20)
CO2: 24 mmol/L (ref 22–32)
Calcium: 8.6 mg/dL — ABNORMAL LOW (ref 8.9–10.3)
Chloride: 107 mmol/L (ref 98–111)
Creatinine, Ser: 0.65 mg/dL (ref 0.44–1.00)
GFR calc Af Amer: >60 mL/min (ref >60)
GFR calc non Af Amer: >60 mL/min (ref >60)
Glucose, Bld: 95 mg/dL (ref 70–99)
Potassium: 3.9 mmol/L (ref 3.5–5.1)
Sodium: 137 mmol/L (ref 135–145)

## 2019-02-25 LAB — POCT I-STAT 4, (NA,K, GLUC, HGB,HCT)
Glucose, Bld: 88 mg/dL (ref 70–99)
HCT: 35 % — ABNORMAL LOW (ref 36.0–46.0)
Hemoglobin: 11.9 g/dL — ABNORMAL LOW (ref 12.0–15.0)
Potassium: 3.9 mmol/L (ref 3.5–5.1)
Sodium: 139 mmol/L (ref 135–145)

## 2019-02-25 LAB — POCT PREGNANCY, URINE: Preg Test, Ur: NEGATIVE

## 2019-02-25 LAB — CBC
HCT: 32.6 % — ABNORMAL LOW (ref 36.0–46.0)
Hemoglobin: 10.5 g/dL — ABNORMAL LOW (ref 12.0–15.0)
MCH: 24.1 pg — ABNORMAL LOW (ref 26.0–34.0)
MCHC: 32.2 g/dL (ref 30.0–36.0)
MCV: 74.9 fL — ABNORMAL LOW (ref 80.0–100.0)
Platelets: 397 10*3/uL (ref 150–400)
RBC: 4.35 MIL/uL (ref 3.87–5.11)
RDW: 18.4 % — ABNORMAL HIGH (ref 11.5–15.5)
WBC: 7.5 10*3/uL (ref 4.0–10.5)
nRBC: 0 % (ref 0.0–0.2)

## 2019-02-25 SURGERY — SALPINGECTOMY, BILATERAL, LAPAROSCOPIC
Anesthesia: General

## 2019-02-25 MED ORDER — FENTANYL CITRATE (PF) 100 MCG/2ML IJ SOLN
25.0000 ug | INTRAMUSCULAR | Status: DC | PRN
  Administered 2019-02-25 (×2): 50 ug via INTRAVENOUS

## 2019-02-25 MED ORDER — OXYCODONE HCL 5 MG PO TABS
5.0000 mg | ORAL_TABLET | Freq: Once | ORAL | Status: AC | PRN
  Administered 2019-02-25: 5 mg via ORAL

## 2019-02-25 MED ORDER — OXYCODONE-ACETAMINOPHEN 5-325 MG PO TABS: 1.0000 | ORAL_TABLET | Freq: Four times a day (QID) | ORAL | 0 refills | Status: DC | PRN

## 2019-02-25 MED ORDER — FENTANYL CITRATE (PF) 100 MCG/2ML IJ SOLN
INTRAMUSCULAR | Status: AC
  Filled 2019-02-25: qty 2

## 2019-02-25 MED ORDER — PHENYLEPHRINE HCL (PRESSORS) 10 MG/ML IV SOLN
INTRAVENOUS | Status: DC | PRN
  Administered 2019-02-25 (×2): 200 ug via INTRAVENOUS
  Administered 2019-02-25 (×2): 100 ug via INTRAVENOUS

## 2019-02-25 MED ORDER — ONDANSETRON HCL 4 MG/2ML IJ SOLN
INTRAMUSCULAR | Status: AC
  Filled 2019-02-25: qty 2

## 2019-02-25 MED ORDER — DEXAMETHASONE SODIUM PHOSPHATE 10 MG/ML IJ SOLN
INTRAMUSCULAR | Status: DC | PRN
  Administered 2019-02-25: 8 mg via INTRAVENOUS

## 2019-02-25 MED ORDER — LIDOCAINE HCL (CARDIAC) PF 100 MG/5ML IV SOSY
PREFILLED_SYRINGE | INTRAVENOUS | Status: DC | PRN
  Administered 2019-02-25: 100 mg via INTRAVENOUS

## 2019-02-25 MED ORDER — SIMETHICONE 80 MG PO CHEW: 80.0000 mg | CHEWABLE_TABLET | Freq: Four times a day (QID) | ORAL | 1 refills | Status: DC | PRN

## 2019-02-25 MED ORDER — VASOPRESSIN 20 UNIT/ML IV SOLN
INTRAVENOUS | Status: AC
  Filled 2019-02-25: qty 1

## 2019-02-25 MED ORDER — ROCURONIUM BROMIDE 100 MG/10ML IV SOLN
INTRAVENOUS | Status: DC | PRN
  Administered 2019-02-25 (×2): 10 mg via INTRAVENOUS
  Administered 2019-02-25: 50 mg via INTRAVENOUS

## 2019-02-25 MED ORDER — FENTANYL CITRATE (PF) 100 MCG/2ML IJ SOLN
INTRAMUSCULAR | Status: DC | PRN
  Administered 2019-02-25: 100 ug via INTRAVENOUS

## 2019-02-25 MED ORDER — IBUPROFEN 800 MG PO TABS: 800.0000 mg | ORAL_TABLET | Freq: Three times a day (TID) | ORAL | 1 refills | Status: AC | PRN

## 2019-02-25 MED ORDER — LACTATED RINGERS IV SOLN
INTRAVENOUS | Status: DC
  Administered 2019-02-25 (×2): via INTRAVENOUS

## 2019-02-25 MED ORDER — MIDAZOLAM HCL 2 MG/2ML IJ SOLN
INTRAMUSCULAR | Status: AC
  Filled 2019-02-25: qty 2

## 2019-02-25 MED ORDER — ROCURONIUM BROMIDE 50 MG/5ML IV SOLN
INTRAVENOUS | Status: AC
  Filled 2019-02-25: qty 1

## 2019-02-25 MED ORDER — PROPOFOL 10 MG/ML IV BOLUS
INTRAVENOUS | Status: DC | PRN
  Administered 2019-02-25: 140 mg via INTRAVENOUS
  Administered 2019-02-25: 60 mg via INTRAVENOUS

## 2019-02-25 MED ORDER — OXYCODONE HCL 5 MG/5ML PO SOLN: 5.0000 mg | Freq: Once | ORAL | Status: AC | PRN

## 2019-02-25 MED ORDER — FAMOTIDINE 20 MG PO TABS: 20.0000 mg | ORAL_TABLET | Freq: Once | ORAL | Status: DC

## 2019-02-25 MED ORDER — SUGAMMADEX SODIUM 200 MG/2ML IV SOLN
INTRAVENOUS | Status: DC | PRN
  Administered 2019-02-25: 150 mg via INTRAVENOUS

## 2019-02-25 MED ORDER — DEXAMETHASONE SODIUM PHOSPHATE 10 MG/ML IJ SOLN
INTRAMUSCULAR | Status: AC
  Filled 2019-02-25: qty 1

## 2019-02-25 MED ORDER — OXYCODONE HCL 5 MG PO TABS
ORAL_TABLET | ORAL | Status: AC
  Filled 2019-02-25: qty 1

## 2019-02-25 MED ORDER — MIDAZOLAM HCL 2 MG/2ML IJ SOLN
INTRAMUSCULAR | Status: DC | PRN
  Administered 2019-02-25: 2 mg via INTRAVENOUS

## 2019-02-25 MED ORDER — BUPIVACAINE HCL (PF) 0.5 % IJ SOLN
INTRAMUSCULAR | Status: AC
  Filled 2019-02-25: qty 30

## 2019-02-25 MED ORDER — BUPIVACAINE HCL 0.5 % IJ SOLN
INTRAMUSCULAR | Status: DC | PRN
  Administered 2019-02-25: 6 mL

## 2019-02-25 MED ORDER — ONDANSETRON HCL 4 MG/2ML IJ SOLN
INTRAMUSCULAR | Status: DC | PRN
  Administered 2019-02-25: 4 mg via INTRAVENOUS

## 2019-02-25 MED ORDER — PROPOFOL 10 MG/ML IV BOLUS
INTRAVENOUS | Status: AC
  Filled 2019-02-25: qty 20

## 2019-02-25 MED ORDER — LIDOCAINE HCL (PF) 2 % IJ SOLN
INTRAMUSCULAR | Status: AC
  Filled 2019-02-25: qty 10

## 2019-02-25 SURGICAL SUPPLY — 47 items
BAG INFUSER PRESSURE 100CC (MISCELLANEOUS) ×3 IMPLANT
BLADE SURG SZ11 CARB STEEL (BLADE) ×3 IMPLANT
CANISTER SUCT 1200ML W/VALVE (MISCELLANEOUS) ×3 IMPLANT
CATH ROBINSON RED A/P 16FR (CATHETERS) ×3 IMPLANT
CHLORAPREP W/TINT 26 (MISCELLANEOUS) ×3 IMPLANT
CORD MONOPOLAR M/FML 12FT (MISCELLANEOUS) IMPLANT
COVER WAND RF STERILE (DRAPES) IMPLANT
DERMABOND ADVANCED (GAUZE/BANDAGES/DRESSINGS) ×1
DERMABOND ADVANCED .7 DNX12 (GAUZE/BANDAGES/DRESSINGS) ×2 IMPLANT
GLOVE BIO SURGEON STRL SZ 6.5 (GLOVE) ×3 IMPLANT
GLOVE BIO SURGEON STRL SZ8 (GLOVE) ×3 IMPLANT
GLOVE INDICATOR 7.0 STRL GRN (GLOVE) ×3 IMPLANT
GOWN STRL REUS W/ TWL LRG LVL3 (GOWN DISPOSABLE) ×4 IMPLANT
GOWN STRL REUS W/TWL LRG LVL3 (GOWN DISPOSABLE) ×2
GOWN STRL REUS W/TWL XL LVL4 (GOWN DISPOSABLE) ×5 IMPLANT
GRASPER SUT TROCAR 14GX15 (MISCELLANEOUS) IMPLANT
IRRIGATION STRYKERFLOW (MISCELLANEOUS) ×2 IMPLANT
IRRIGATOR STRYKERFLOW (MISCELLANEOUS) ×3
IV LACTATED RINGER IRRG 3000ML (IV SOLUTION)
IV LACTATED RINGERS 1000ML (IV SOLUTION) ×4 IMPLANT
IV LR IRRIG 3000ML ARTHROMATIC (IV SOLUTION) ×2 IMPLANT
KIT PINK PAD W/HEAD ARE REST (MISCELLANEOUS) ×3
KIT PINK PAD W/HEAD ARM REST (MISCELLANEOUS) ×2 IMPLANT
KIT PROCEDURE FLUENT (KITS) ×2 IMPLANT
KIT TURNOVER CYSTO (KITS) ×3 IMPLANT
NS IRRIG 500ML POUR BTL (IV SOLUTION) ×3 IMPLANT
PACK DNC HYST (MISCELLANEOUS) ×3 IMPLANT
PACK GYN LAPAROSCOPIC (MISCELLANEOUS) ×3 IMPLANT
PAD OB MATERNITY 4.3X12.25 (PERSONAL CARE ITEMS) ×3 IMPLANT
PAD PREP 24X41 OB/GYN DISP (PERSONAL CARE ITEMS) ×3 IMPLANT
POUCH ENDO CATCH 10MM SPEC (MISCELLANEOUS) IMPLANT
POUCH SPECIMEN RETRIEVAL 10MM (ENDOMECHANICALS) IMPLANT
SCISSORS METZENBAUM CVD 33 (INSTRUMENTS) ×1 IMPLANT
SEAL ROD LENS SCOPE MYOSURE (ABLATOR) IMPLANT
SET CYSTO W/LG BORE CLAMP LF (SET/KITS/TRAYS/PACK) ×1 IMPLANT
SET TUBE SMOKE EVAC HIGH FLOW (TUBING) ×3 IMPLANT
SHEARS HARMONIC ACE PLUS 36CM (ENDOMECHANICALS) ×1 IMPLANT
SLEEVE ENDOPATH XCEL 5M (ENDOMECHANICALS) ×3 IMPLANT
SOL PREP PVP 2OZ (MISCELLANEOUS) ×3
SOLUTION PREP PVP 2OZ (MISCELLANEOUS) ×2 IMPLANT
SURGILUBE 2OZ TUBE FLIPTOP (MISCELLANEOUS) ×3 IMPLANT
SUT VIC AB 3-0 SH 27 (SUTURE)
SUT VIC AB 3-0 SH 27X BRD (SUTURE) IMPLANT
SUT VICRYL 0 AB UR-6 (SUTURE) ×3 IMPLANT
TROCAR ENDO BLADELESS 11MM (ENDOMECHANICALS) ×3 IMPLANT
TROCAR XCEL NON-BLD 5MMX100MML (ENDOMECHANICALS) ×4 IMPLANT
TROCAR XCEL UNIV SLVE 11M 100M (ENDOMECHANICALS) ×3 IMPLANT

## 2019-02-25 NOTE — Transfer of Care (Signed)
Immediate Anesthesia Transfer of Care Note  Patient: Donna Perry  Procedure(s) Performed: LAPAROSCOPIC BILATERAL SALPINGECTOMY (Bilateral ) DILATATION AND CURETTAGE /HYSTEROSCOPY (N/A )  Patient Location: PACU  Anesthesia Type:General  Level of Consciousness: awake, alert  and oriented  Airway & Oxygen Therapy: Patient Spontanous Breathing and Patient connected to face mask oxygen  Post-op Assessment: Report given to RN and Post -op Vital signs reviewed and stable  Post vital signs: Reviewed and stable  Last Vitals:  Vitals Value Taken Time  BP    Temp    Pulse    Resp 18 02/25/19 1519  SpO2    Vitals shown include unvalidated device data.  Last Pain:  Vitals:   02/25/19 1104  TempSrc: Temporal  PainSc: 0-No pain         Complications: No apparent anesthesia complications

## 2019-02-25 NOTE — OR Nursing (Signed)
Dr. Marcelline Mates in to speak with patient prior to patient discharge.

## 2019-02-25 NOTE — Anesthesia Post-op Follow-up Note (Signed)
Anesthesia QCDR form completed.        

## 2019-02-25 NOTE — Discharge Instructions (Signed)

## 2019-02-25 NOTE — Anesthesia Procedure Notes (Addendum)
Procedure Name: Intubation Date/Time: 02/25/2019 12:28 PM Performed by: Rona Ravens, CRNA Pre-anesthesia Checklist: Patient identified, Emergency Drugs available, Suction available and Patient being monitored Patient Re-evaluated:Patient Re-evaluated prior to induction Oxygen Delivery Method: Circle system utilized Preoxygenation: Pre-oxygenation with 100% oxygen Induction Type: IV induction Ventilation: Mask ventilation without difficulty Laryngoscope Size: Mac and 4 Grade View: Grade II Tube size: 7.0 mm Number of attempts: 1 Airway Equipment and Method: Stylet Placement Confirmation: ETT inserted through vocal cords under direct vision,  positive ETCO2,  breath sounds checked- equal and bilateral and CO2 detector Secured at: 21 cm Tube secured with: Tape Dental Injury: Teeth and Oropharynx as per pre-operative assessment  Difficulty Due To: Difficult Airway- due to limited oral opening and Difficult Airway- due to reduced neck mobility

## 2019-02-25 NOTE — Anesthesia Preprocedure Evaluation (Signed)
Anesthesia Evaluation  Patient identified by MRN, date of birth, ID band Patient awake    Reviewed: Allergy & Precautions, H&P , NPO status , Patient's Chart, lab work & pertinent test results  History of Anesthesia Complications Negative for: history of anesthetic complications  Airway Mallampati: III  TM Distance: >3 FB Neck ROM: full    Dental  (+) Chipped   Pulmonary neg shortness of breath, asthma ,           Cardiovascular Exercise Tolerance: Good hypertension, Pt. on medications and Pt. on home beta blockers (-) angina(-) Past MI and (-) DOE      Neuro/Psych negative neurological ROS  negative psych ROS   GI/Hepatic negative GI ROS, Neg liver ROS, neg GERD  ,  Endo/Other  negative endocrine ROS  Renal/GU      Musculoskeletal   Abdominal   Peds  Hematology negative hematology ROS (+)   Anesthesia Other Findings Past Medical History: No date: Anemia No date: Hypertension No date: Seasonal allergies  Past Surgical History: No date: ESSURE TUBAL LIGATION No date: LEEP No date: LEG SURGERY; Left     Reproductive/Obstetrics negative OB ROS                             Anesthesia Physical Anesthesia Plan  ASA: III  Anesthesia Plan: General ETT   Post-op Pain Management:    Induction: Intravenous  PONV Risk Score and Plan: Ondansetron, Dexamethasone, Midazolam and Treatment may vary due to age or medical condition  Airway Management Planned: Oral ETT  Additional Equipment:   Intra-op Plan:   Post-operative Plan: Extubation in OR  Informed Consent: I have reviewed the patients History and Physical, chart, labs and discussed the procedure including the risks, benefits and alternatives for the proposed anesthesia with the patient or authorized representative who has indicated his/her understanding and acceptance.     Dental Advisory Given  Plan Discussed with:  Anesthesiologist, CRNA and Surgeon  Anesthesia Plan Comments: (Patient consented for risks of anesthesia including but not limited to:  - adverse reactions to medications - damage to teeth, lips or other oral mucosa - sore throat or hoarseness - Damage to heart, brain, lungs or loss of life  Patient voiced understanding.)        Anesthesia Quick Evaluation

## 2019-02-25 NOTE — Op Note (Signed)
Procedure(s): LAPAROSCOPIC BILATERAL SALPINGECTOMY, REMOVAL OF ESSURE COIL,CYSTOSCOPY DILATATION AND CURETTAGE /HYSTEROSCOPY Procedure Note  Donna Perry female 43 y.o. 02/25/2019  Indications: The patient is a 43 y.o. G64P1001 female with menometrorrhagia, pelvic pain, post tubal ligation syndrome (h/o Essure occlusion), uterine leiomyoma, iron deficiency anemia due to blood loss. Desires Essure coil removal and surgical intervention for abnormal bleeding.     Pre-operative Diagnosis: Menometrorrhagia, pelvic pain, post tubal ligation syndrome (h/o Essure occlusion), uterine leiomyoma, iron deficiency anemia due to blood loss.  Post-operative Diagnosis: Same, with large cystic pelvic mass  Surgeon: Rubie Maid, MD.  Intraoperative consult by Dr. Hollice Espy for pelvic mass (possibly originating from the bladder.   Assistants:  Surgical Scrub tech.  Anesthesia: General endotracheal anesthesia  Procedure Details: The patient was seen in the Holding Room. The risks, benefits, complications, treatment options, and expected outcomes were discussed with the patient.  The patient concurred with the proposed plan, giving informed consent.  The site of surgery properly noted/marked. The patient was taken to the Operating Room, identified as Donna Perry and the procedure verified as Procedure(s) (LRB): LAPAROSCOPIC BILATERAL SALPINGECTOMY, REMOVAL OF ESSURE COIL,CYSTOSCOPY (Bilateral) DILATATION AND CURETTAGE /HYSTEROSCOPY (N/A). A Time Out was held and the above information confirmed.  She was then placed under general anesthesia without difficulty. She was placed in the dorsal lithotomy position, and was prepped and draped in a sterile manner.  After an adequate timeout was performed, a straight catheterization was performed. A sterile speculum was inserted into the vagina, however the cervix could not be readily identified. Attempts were made to grasp what was thought to be the cervix with a ring  forceps, however unsuccessful as cervix was displaced very anteriorly. The speculum was removed from the vagina. A sponge stick was placed in the vagina for uterine manipulation.   Attention was turned to the abdomen where an umbilical incision was made with the scalpel. The Optiview 5-mm trocar and sleeve were then advanced without difficulty with the laparoscope under direct visualization into the abdomen.  The abdomen was then insufflated with carbon dioxide gas and adequate pneumoperitoneum was obtained. A 5-mm left lower quadrant port and an 5-mm right lower quadrant port were then placed under direct visualization.  A survey of the patient's pelvis and abdomen revealed the findings as above. An intraoperative consultation was placed to Dr. Erlene Quan due to the identification of a large cystic mass appearing directly above the uterus, with no visibility of the bladder.  The decision was made to order a CT scan of the abdomen and pelvis, and to follow up outpatient with Urology.   The right fallopian tube was transected from the underlying mesosalpinx with the Harmonic device.  The Essure coil was located approximately 1.5 cm distally from the uterine cornual region.  The fallopian tube was cut using cold shears down to the lumen to allow exposure of the edge of the Essure coil.  The coil was grasped using a grasper and the coils were pulled on constant tension until completely removed from the tubal ostia.  The proximal edge of the remaining fallopian tube was then cauterized using the Harmonic device until the fallopian tube was transected from its uterine attachment.  The same procedure was performed on the left fallopain tube, allowing for removal of both Essure coils and allowing for bilateral salpingectomy.  The fallopian tubes and Essure coils were then removed from the abdomen under direct visualization.  The operative site was surveyed, and it was found to be hemostatic.  No intraoperative injury to  other surrounding organs was noted.  The abdomen was desufflated and all instruments were then removed from the patient's abdomen.  All skin incisions were closed with 3-0 Vicryl and Dermabond.  The sponge stick was removed from the vagina.    A sterile speculum was again inserted into vagina. At this time, the cervix was better able to visualized, and a single-tooth tenaculum was used to grasp the anterior lip of the cervix. The uterus was sounded to 9 cm. Cervical dilation was performed. A 5 mm hysteroscope was introduced into the uterus under direct visualization. The cavity was allowed to fill, and then the entire cavity was explored with the findings described above. Both tubal ostia were identified, with no presence of any residual Essure coil material visible. The hysteroscope was removed, and a sharp curette was then passed into the uterus and endometrial sampling was collected for pathology. All instruments were the removed from the vagina.   Next, a cystoscopy was performed  A 0-degree rigid cystoscope was used to inspect both the urethra and bladder.    Normal bladder mucosa was noted, without lesions. Ureteral orifices was/were seen bilaterally with efflux. The trigone appeared to be elevated, but otherwise normal. The cystoscope was removed from the bladder.   All instrument and sponge counts were correct at the end of the procedure x 2.  The patient tolerated the procedure well.  She was awakened and taken to the PACU in stable condition. Patient will have CT of the abdomen/pelvis ordered as outpatient, and will f/u with Urology.    Findings: The uterus was sounded to 9 cm. Fallopian tubes and ovaries appeared normal.  Large cystic mass above the uterus, obscuring the bladder, possibly originating from the bladder itself vs the pelvic peritoneum (inclusion cyst). Essure coils visualized bilaterally on dissection of the fallopian tubes.  Hysteroscopy with proliferative endometrium. Tubal  ostia were identified bilaterally.  Cystoscopy with normal bladder mucosa, no lesions. Normal ureteric orifices, raised trigone.   Estimated Blood Loss:  10 ml      Drains: straight catheterization prior to procedure with 150 ml of clear urine         Total IV Fluids: 1000  ml  Specimens: 1. Bilateral fallopian tubes;  2. Bilateral Essure coils;  3. Endometrial curettings         Implants: None         Complications:  None; patient tolerated the procedure well.         Disposition: PACU - hemodynamically stable.         Condition: stable   Rubie Maid, MD [facility]

## 2019-02-25 NOTE — Interval H&P Note (Signed)
History and Physical Interval Note:  02/25/2019 11:57 AM  Donna Perry  has presented today for surgery, with the diagnosis of MENOMETRORRHAGIA, DYSMENORRHEA, PELVIC PAIN, POST TUBAL LIGATION SYNDROME, UTERINE LEIMYOMA, IRON DEFICIENCY ANEMIA DUE TO CHRONIC BLOOD LOSS.  The various methods of treatment have been discussed with the patient and family. After consideration of risks, benefits and other options for treatment, the patient has consented to  Procedure(s): LAPAROSCOPIC BILATERAL SALPINGECTOMY (Bilateral) with REMOVAL OF ESSURE COILS, DILATATION AND CURETTAGE /HYSTEROSCOPY (N/A) as a surgical intervention.  The patient's history has been reviewed, patient examined, no change in status, stable for surgery.  I have reviewed the patient's chart and labs.  Questions were answered to the patient's satisfaction.     Rubie Maid, MD [facility]

## 2019-02-27 ENCOUNTER — Encounter: Payer: Self-pay | Admitting: Obstetrics and Gynecology

## 2019-02-28 ENCOUNTER — Telehealth: Payer: Self-pay | Admitting: Obstetrics and Gynecology

## 2019-02-28 NOTE — Anesthesia Postprocedure Evaluation (Signed)
Anesthesia Post Note  Patient: Donna Perry  Procedure(s) Performed: LAPAROSCOPIC BILATERAL SALPINGECTOMY, REMOVAL OF ESSURE COIL,CYSTOSCOPY (Bilateral ) DILATATION AND CURETTAGE /HYSTEROSCOPY (N/A )  Patient location during evaluation: PACU Anesthesia Type: General Level of consciousness: awake and alert and oriented Pain management: pain level controlled Vital Signs Assessment: post-procedure vital signs reviewed and stable Respiratory status: spontaneous breathing Cardiovascular status: blood pressure returned to baseline Anesthetic complications: no     Last Vitals:  Vitals:   02/25/19 1640 02/25/19 1700  BP: 125/75 127/80  Pulse: 87 87  Resp: 18 16  Temp: 36.9 C   SpO2: 100% 100%    Last Pain:  Vitals:   02/25/19 1700  TempSrc:   PainSc: 4                  Belal Scallon

## 2019-02-28 NOTE — Telephone Encounter (Signed)
Pt was called to schedule an appointment for 1 week post op after surgery. Pt made an appointment today for that visit.

## 2019-02-28 NOTE — Telephone Encounter (Signed)
The patient called and stated that she would like to speak with Dr. Marcelline Mates or her nurse in regards to her needing to follow up with Dr. Marcelline Mates in 1 wk for post op after surgery. Please advise.

## 2019-03-03 ENCOUNTER — Ambulatory Visit (INDEPENDENT_AMBULATORY_CARE_PROVIDER_SITE_OTHER): Payer: BC Managed Care – PPO | Admitting: Obstetrics and Gynecology

## 2019-03-03 ENCOUNTER — Other Ambulatory Visit: Payer: Self-pay

## 2019-03-03 ENCOUNTER — Encounter: Payer: Self-pay | Admitting: Obstetrics and Gynecology

## 2019-03-03 VITALS — BP 124/87 | HR 106 | Ht 60.0 in | Wt 158.0 lb

## 2019-03-03 DIAGNOSIS — R399 Unspecified symptoms and signs involving the genitourinary system: Secondary | ICD-10-CM

## 2019-03-03 DIAGNOSIS — Z9079 Acquired absence of other genital organ(s): Secondary | ICD-10-CM

## 2019-03-03 DIAGNOSIS — Z9889 Other specified postprocedural states: Secondary | ICD-10-CM

## 2019-03-03 DIAGNOSIS — N921 Excessive and frequent menstruation with irregular cycle: Secondary | ICD-10-CM

## 2019-03-03 DIAGNOSIS — N3289 Other specified disorders of bladder: Secondary | ICD-10-CM | POA: Diagnosis not present

## 2019-03-03 LAB — POCT URINALYSIS DIPSTICK
Bilirubin, UA: NEGATIVE
Glucose, UA: NEGATIVE
Ketones, UA: NEGATIVE
Nitrite, UA: NEGATIVE
Protein, UA: NEGATIVE
Spec Grav, UA: 1.01 (ref 1.010–1.025)
Urobilinogen, UA: 0.2 E.U./dL
pH, UA: 7 (ref 5.0–8.0)

## 2019-03-03 LAB — SURGICAL PATHOLOGY

## 2019-03-03 NOTE — Progress Notes (Signed)
Pt present today for 1 week post op. Pt stated that she was doing, but noticed when she urinated that she has pressure/pain in the abd area.  Pt also stated that the belly button incision area is itching. No other problems.

## 2019-03-03 NOTE — Progress Notes (Signed)
    OBSTETRICS/GYNECOLOGY POST-OPERATIVE CLINIC VISIT  Subjective:     Donna Perry is a 43 y.o. G67P1001 female who presents to the clinic 1 week status post bilateral salpingectomy, Essure coil removal, and dilation & curettage for abnormal uterine bleeding, pelvic pain and history of Essure placement. Eating a regular diet without difficulty. Bleeding: only spotting noted.  The patient is not having any pain.  She does note some urinary discomfort over the past several days.   The following portions of the patient's history were reviewed and updated as appropriate: allergies, current medications, past family history, past medical history, past social history, past surgical history and problem list.  Review of Systems Pertinent items noted in HPI and remainder of comprehensive ROS otherwise negative.    Objective:    BP 124/87   Pulse (!) 106   Ht 5' (1.524 m)   Wt 158 lb (71.7 kg)   LMP 02/12/2019   BMI 30.86 kg/m  General:  alert and no distress  Abdomen: soft, bowel sounds active, non-tender  Incision:   healing well, no drainage, no erythema, no hernia, no seroma, no swelling, no dehiscence, incision well approximated     Pathology:  Endometrium, biopsy: - BENIGN PROLIFERATIVE TYPE ENDOMETRIUM. - THERE IS NO EVIDENCE OF HYPERPLASIA OR MALIGNANCY.    Labs:  Results for orders placed or performed in visit on 03/03/19  POCT urinalysis dipstick  Result Value Ref Range   Color, UA yellow    Clarity, UA clear    Glucose, UA Negative Negative   Bilirubin, UA neg    Ketones, UA neg    Spec Grav, UA 1.010 1.010 - 1.025   Blood, UA mod    pH, UA 7.0 5.0 - 8.0   Protein, UA Negative Negative   Urobilinogen, UA 0.2 0.2 or 1.0 E.U./dL   Nitrite, UA neg    Leukocytes, UA Moderate (2+) (A) Negative   Appearance yellow    Odor       Assessment:    Doing well postoperatively. Status post bilateral salpingectomy S/P dilatation and curettage  Menometrorrhagia  Bladder  mass  UTI symptoms  Plan:   1. Wound care discussed.  2. Menometrorrhagia improved so far after surgery, will continue to monitor.  3. Operative findings again reviewed. Pathology report discussed. Incidental large pelvic mass noted during recent surgery, unclear origin, possibly bladder vs peritoneal inclusion cyst.  Intraoperative consult done by Dr. Hollice Espy, recommended outpatient CT scan and f/u with urology. Orders placed.  4. UTI symptoms, UA otherwise normal but with small amount of blood (however could also be contaminated from vaginal spotting) and 2+ leukocytes.  Can send culture.  5. Activity restrictions: none 6. Anticipated return to work: now. 7. Follow up: as needed.      Rubie Maid, MD [facility]

## 2019-03-03 NOTE — Addendum Note (Signed)
Addended by: Edwyna Shell on: 03/03/2019 01:04 PM   Modules accepted: Orders

## 2019-03-04 ENCOUNTER — Encounter: Payer: Self-pay | Admitting: Obstetrics and Gynecology

## 2019-03-09 ENCOUNTER — Other Ambulatory Visit: Payer: Self-pay

## 2019-03-09 ENCOUNTER — Ambulatory Visit
Admission: RE | Admit: 2019-03-09 | Discharge: 2019-03-09 | Disposition: A | Discharge status: Home / Self Care | Payer: BC Managed Care – PPO | Source: Ambulatory Visit | Attending: Obstetrics and Gynecology | Admitting: Obstetrics and Gynecology

## 2019-03-09 ENCOUNTER — Ambulatory Visit: Admission: RE | Admit: 2019-03-09 | Payer: BC Managed Care – PPO | Source: Ambulatory Visit

## 2019-03-09 DIAGNOSIS — R1900 Intra-abdominal and pelvic swelling, mass and lump, unspecified site: Secondary | ICD-10-CM | POA: Insufficient documentation

## 2019-03-09 MED ORDER — IOHEXOL 300 MG/ML  SOLN
125.0000 mL | Freq: Once | INTRAMUSCULAR | Status: AC | PRN
  Administered 2019-03-09: 150 mL via INTRAVENOUS

## 2019-03-10 LAB — URINE CULTURE

## 2019-03-10 MED ORDER — CIPROFLOXACIN HCL 500 MG PO TABS: 500.0000 mg | ORAL_TABLET | Freq: Two times a day (BID) | ORAL | 0 refills | Status: DC

## 2019-03-10 NOTE — Addendum Note (Signed)
Addended by: Augusto Gamble on: 03/10/2019 01:06 PM   Modules accepted: Orders

## 2019-04-12 ENCOUNTER — Ambulatory Visit: Payer: BC Managed Care – PPO | Admitting: Urology

## 2019-04-13 ENCOUNTER — Ambulatory Visit (INDEPENDENT_AMBULATORY_CARE_PROVIDER_SITE_OTHER): Payer: BC Managed Care – PPO | Admitting: Urology

## 2019-04-13 ENCOUNTER — Encounter: Payer: Self-pay | Admitting: Urology

## 2019-04-13 ENCOUNTER — Other Ambulatory Visit: Payer: Self-pay

## 2019-04-13 VITALS — BP 120/84 | HR 96 | Ht 60.0 in | Wt 158.0 lb

## 2019-04-13 DIAGNOSIS — I10 Essential (primary) hypertension: Secondary | ICD-10-CM | POA: Insufficient documentation

## 2019-04-13 DIAGNOSIS — N281 Cyst of kidney, acquired: Secondary | ICD-10-CM

## 2019-04-15 ENCOUNTER — Encounter: Payer: Self-pay | Admitting: Urology

## 2019-04-15 NOTE — Progress Notes (Signed)
04/13/2019 7:06 AM   Donna Perry 31-May-1976 [id number]  Referring provider: Elijah Birk, [address] [place],  [place] [address]  Chief Complaint  Patient presents with  . Referral    Bladder mass    HPI: Terina Brain is a 43 y.o. female status post bilateral laparoscopic salpingectomy by Dr. Marcelline Mates on 02/25/2019 with intraoperative finding of a cystic mass superior to the uterus.  An intraoperative consultation was obtained and Dr. Erlene Quan was called.  She felt the mass may be the dome of the bladder and a follow-up CT was recommended.  She has no complaints.  Since her gynecologic procedure she does note mild urinary frequency and urgency.  A CT of the abdomen and pelvis with and without contrast was obtained on 03/09/2019.  No large cystic masses were identified.  A 12 mm low-density lesion in the left kidney was identified felt to favor a Bosniak 2 cyst.   PMH: Past Medical History:  Diagnosis Date  . Anemia   . Hypertension   . Seasonal allergies     Surgical History: Past Surgical History:  Procedure Laterality Date  . ESSURE TUBAL LIGATION    . HYSTEROSCOPY W/D&C N/A 02/25/2019   Procedure: DILATATION AND CURETTAGE /HYSTEROSCOPY;  Surgeon: Rubie Maid, MD;  Location: [facility] OR[facility];  Service: Gynecology;  Laterality: N/A;  . LAPAROSCOPIC BILATERAL SALPINGECTOMY Bilateral 02/25/2019   Procedure: LAPAROSCOPIC BILATERAL SALPINGECTOMY, REMOVAL OF ESSURE COIL,CYSTOSCOPY;  Surgeon: Rubie Maid, MD;  Location: [facility] ORS;  Service: Gynecology;  Laterality: Bilateral;  . LEEP    . LEG SURGERY Left     Home Medications:  Allergies as of 04/13/2019      Reactions   Sulfamethoxazole-trimethoprim Hives   Sulfur       Medication List       Accurate as of April 13, 2019 11:59 PM. If you have any questions, ask your nurse or doctor.        STOP taking these medications   oxyCODONE-acetaminophen 5-325 MG tablet Commonly known as: PERCOCET/ROXICET Stopped by: Abbie Sons, MD     TAKE these medications   acidophilus Caps capsule Take 1 capsule by mouth 2 (two) times a day.   CALCIUM-VITAMIN D PO Take 1 tablet by mouth daily.   ciprofloxacin 500 MG tablet Commonly known as: CIPRO Take 1 tablet (500 mg total) by mouth 2 (two) times daily.   docusate sodium 100 MG capsule Commonly known as: COLACE Take 1 capsule (100 mg total) by mouth 2 (two) times daily as needed.   ELDERBERRY PO Take 5 mLs by mouth daily.   EpiPen 2-Pak 0.3 mg/0.3 mL Soaj injection Generic drug: EPINEPHrine Inject 0.3 mg into the muscle as needed for anaphylaxis.   ferrous sulfate 325 (65 FE) MG EC tablet Take 325 mg by mouth 2 (two) times a day.   fexofenadine 180 MG tablet Commonly known as: ALLEGRA Take 180 mg by mouth.   fluticasone 50 MCG/ACT nasal spray Commonly known as: FLONASE Place 1 spray into both nostrils daily as needed for allergies.   hydrochlorothiazide 25 MG tablet Commonly known as: HYDRODIURIL Take 25 mg by mouth daily.   ibuprofen 800 MG tablet Commonly known as: ADVIL Take 1 tablet (800 mg total) by mouth every 8 (eight) hours as needed.   levocetirizine 5 MG tablet Commonly known as: XYZAL Take 5 mg by mouth every evening.   lisinopril 5 MG tablet Commonly known as: ZESTRIL Take 5 mg by mouth daily.  loratadine 10 MG tablet Commonly known as: CLARITIN Take 10 mg by mouth daily.   nystatin powder Commonly known as: MYCOSTATIN/NYSTOP Apply 1 Bottle topically 2 (two) times daily as needed (IRRITATION).   Potassium 99 MG Tabs Take 99 mg by mouth daily as needed (POTASSIUM LEVELS).   simethicone 80 MG chewable tablet Commonly known as: Gas-X Chew 1 tablet (80 mg total) by mouth 4 (four) times daily as needed for flatulence.   Turmeric 500 MG Caps Take 500 mg by mouth daily as needed (PT PREF).       Allergies:  Allergies  Allergen Reactions  . Sulfamethoxazole-Trimethoprim Hives  . Sulfur     Family History:  Family History  Problem Relation Age of Onset  . Cervical cancer Mother   . Hypertension Mother   . Lung cancer Father   . Breast cancer Neg Hx   . Heart failure Neg Hx   . Seizures Neg Hx     Social History:  reports that she has never smoked. She has never used smokeless tobacco. She reports current alcohol use. She reports that she does not use drugs.  ROS: UROLOGY Frequent Urination?: Yes Hard to postpone urination?: No Burning/pain with urination?: No Get up at night to urinate?: Yes Leakage of urine?: No Urine stream starts and stops?: No Trouble starting stream?: No Do you have to strain to urinate?: No Blood in urine?: No Urinary tract infection?: No Sexually transmitted disease?: No Injury to kidneys or bladder?: No Painful intercourse?: No Weak stream?: No Currently pregnant?: No Vaginal bleeding?: No Last menstrual period?: N/A  Gastrointestinal Nausea?: No Vomiting?: No Indigestion/heartburn?: No Diarrhea?: No Constipation?: No  Constitutional Fever: No Night sweats?: No Weight loss?: No Fatigue?: No  Skin Skin rash/lesions?: No Itching?: No  Eyes Blurred vision?: No Double vision?: No  Ears/Nose/Throat Sore throat?: No Sinus problems?: No  Hematologic/Lymphatic Swollen glands?: No Easy bruising?: No  Cardiovascular Leg swelling?: No Chest pain?: No  Respiratory Cough?: No Shortness of breath?: No  Endocrine Excessive thirst?: No  Musculoskeletal Back pain?: No Joint pain?: No  Neurological Headaches?: No Dizziness?: No  Psychologic Depression?: No Anxiety?: No  Physical Exam: BP 120/84 (BP Location: Left Arm, Patient Position: Sitting, Cuff Size: Normal)   Pulse 96   Ht 5' (1.524 m)   Wt 158 lb (71.7 kg)   BMI 30.86 kg/m   Constitutional:  Alert and oriented, No acute distress. HEENT: [place] AT, moist mucus membranes.  Trachea midline, no masses. Cardiovascular: No clubbing, cyanosis, or edema. Respiratory: Normal  respiratory effort, no increased work of breathing. Skin: No rashes, bruises or suspicious lesions. Neurologic: Grossly intact, no focal deficits, moving all 4 extremities. Psychiatric: Normal mood and affect.   Pertinent Imaging: CT personally reviewed  Assessment & Plan:   No evidence of cystic pelvic mass on CT. PVR by bladder scan was 112 mL.  She does have a probable Bosniak 2 renal cyst and will obtain a follow-up ultrasound in 6 months.  Return in about 6 months (around 10/11/2019) for Recheck, renal ultrasound.  Abbie Sons, [place] [address] [place], [place] [address] [phone]

## 2019-10-17 ENCOUNTER — Ambulatory Visit: Payer: BC Managed Care – PPO | Admitting: Urology

## 2019-10-18 ENCOUNTER — Other Ambulatory Visit: Payer: Self-pay | Admitting: *Deleted

## 2019-10-18 DIAGNOSIS — N281 Cyst of kidney, acquired: Secondary | ICD-10-CM

## 2019-10-19 ENCOUNTER — Ambulatory Visit: Payer: Self-pay | Admitting: Urology

## 2019-10-25 ENCOUNTER — Other Ambulatory Visit: Payer: Self-pay

## 2019-10-25 ENCOUNTER — Ambulatory Visit
Admission: RE | Admit: 2019-10-25 | Discharge: 2019-10-25 | Disposition: A | Discharge status: Home / Self Care | Payer: BC Managed Care – PPO | Source: Ambulatory Visit | Attending: Urology | Admitting: Urology

## 2019-10-25 DIAGNOSIS — N281 Cyst of kidney, acquired: Secondary | ICD-10-CM | POA: Diagnosis not present

## 2019-10-28 ENCOUNTER — Telehealth: Payer: Self-pay

## 2019-10-28 NOTE — Telephone Encounter (Signed)
-----   Message from Chrystie Nose, [place] sent at 10/28/2019  8:21 AM EDT -----  ----- Message ----- From: Abbie Sons, MD Sent: 10/28/2019   7:48 AM EDT To: Chrystie Nose, CMA  Renal ultrasound shows a stable septated cyst.  Keep follow-up as scheduled

## 2019-10-28 NOTE — Telephone Encounter (Signed)
Left message on voicemail for patient to return call. 

## 2019-10-28 NOTE — Telephone Encounter (Signed)
Left a msg for pt to please return call.

## 2019-10-31 NOTE — Telephone Encounter (Signed)
Patient notified of renal ultrasound results.  She expressed understanding.    Follow up appointment reviewed with the patient.

## 2019-11-23 ENCOUNTER — Other Ambulatory Visit: Payer: Self-pay | Admitting: Urology

## 2019-11-23 ENCOUNTER — Other Ambulatory Visit: Payer: Self-pay

## 2019-11-23 ENCOUNTER — Ambulatory Visit (INDEPENDENT_AMBULATORY_CARE_PROVIDER_SITE_OTHER): Payer: BC Managed Care – PPO | Admitting: Urology

## 2019-11-23 ENCOUNTER — Encounter: Payer: Self-pay | Admitting: Urology

## 2019-11-23 VITALS — BP 124/84 | HR 109 | Ht 60.0 in | Wt 154.0 lb

## 2019-11-23 DIAGNOSIS — N281 Cyst of kidney, acquired: Secondary | ICD-10-CM | POA: Diagnosis not present

## 2019-11-23 DIAGNOSIS — N3281 Overactive bladder: Secondary | ICD-10-CM | POA: Insufficient documentation

## 2019-11-23 MED ORDER — SOLIFENACIN SUCCINATE 5 MG PO TABS: 5.0000 mg | ORAL_TABLET | Freq: Every day | ORAL | 11 refills | Status: DC

## 2019-11-23 NOTE — Progress Notes (Signed)
11/23/2019 9:00 AM   Donna Perry 12/17/1975 [id number]  Referring provider: Elijah Birk, [address] [place],  [place] [address]  Chief Complaint  Patient presents with  . Follow-up    Urologic history: 1.  Intraoperative consultation with Dr. Erlene Quan July 2020 for a cystic mass superior uterus which Dr. Erlene Quan felt was the dome of the bladder.  Follow-up CT showed no pelvic masses.  Incidentally noted Bosniak 43F left renal cyst   HPI: 44 y.o. female presents for follow-up of a Bosniak 43F renal cyst.  -Follow up ultrasound performed 10/2019 with a stable 3 x 1.9 x 1.7 cm septated renal cyst -Had onset of urinary frequency, urgency with rare small-volume urge incontinence after gynecologic surgery which has persisted -Also with nocturia x4 -No dysuria or gross hematuria   PMH: Past Medical History:  Diagnosis Date  . Anemia   . Hypertension   . Seasonal allergies     Surgical History: Past Surgical History:  Procedure Laterality Date  . ESSURE TUBAL LIGATION    . HYSTEROSCOPY WITH D & C N/A 02/25/2019   Procedure: DILATATION AND CURETTAGE /HYSTEROSCOPY;  Surgeon: Rubie Maid, MD;  Location: [facility];  Service: Gynecology;  Laterality: N/A;  . LAPAROSCOPIC BILATERAL SALPINGECTOMY Bilateral 02/25/2019   Procedure: LAPAROSCOPIC BILATERAL SALPINGECTOMY, REMOVAL OF ESSURE COIL,CYSTOSCOPY;  Surgeon: Rubie Maid, MD;  Location: [facility];  Service: Gynecology;  Laterality: Bilateral;  . LEEP    . LEG SURGERY Left     Home Medications:  Allergies as of 11/23/2019      Reactions   Sulfamethoxazole-trimethoprim Hives   Sulfur       Medication List       Accurate as of November 23, 2019  9:00 AM. If you have any questions, ask your nurse or doctor.        acidophilus Caps capsule Take 1 capsule by mouth 2 (two) times a day.   CALCIUM-VITAMIN D PO Take 1 tablet by mouth daily.   ciprofloxacin 500 MG tablet Commonly known as: CIPRO Take 1 tablet (500 mg  total) by mouth 2 (two) times daily.   docusate sodium 100 MG capsule Commonly known as: COLACE Take 1 capsule (100 mg total) by mouth 2 (two) times daily as needed.   ELDERBERRY PO Take 5 mLs by mouth daily.   EpiPen 2-Pak 0.3 mg/0.3 mL Soaj injection Generic drug: EPINEPHrine Inject 0.3 mg into the muscle as needed for anaphylaxis.   ferrous sulfate 325 (65 FE) MG EC tablet Take 325 mg by mouth 2 (two) times a day.   fexofenadine 180 MG tablet Commonly known as: ALLEGRA Take 180 mg by mouth.   fluticasone 50 MCG/ACT nasal spray Commonly known as: FLONASE Place 1 spray into both nostrils daily as needed for allergies.   hydrochlorothiazide 25 MG tablet Commonly known as: HYDRODIURIL Take 25 mg by mouth daily.   ibuprofen 800 MG tablet Commonly known as: ADVIL Take 1 tablet (800 mg total) by mouth every 8 (eight) hours as needed.   levocetirizine 5 MG tablet Commonly known as: XYZAL Take 5 mg by mouth every evening.   lisinopril 5 MG tablet Commonly known as: ZESTRIL Take 5 mg by mouth daily.   loratadine 10 MG tablet Commonly known as: CLARITIN Take 10 mg by mouth daily.   nystatin powder Commonly known as: MYCOSTATIN/NYSTOP Apply 1 Bottle topically 2 (two) times daily as needed (IRRITATION).   Potassium 99 MG Tabs Take 99 mg by mouth daily as  needed (POTASSIUM LEVELS).   simethicone 80 MG chewable tablet Commonly known as: Gas-X Chew 1 tablet (80 mg total) by mouth 4 (four) times daily as needed for flatulence.   Turmeric 500 MG Caps Take 500 mg by mouth daily as needed (PT PREF).       Allergies:  Allergies  Allergen Reactions  . Sulfamethoxazole-Trimethoprim Hives  . Sulfur     Family History: Family History  Problem Relation Age of Onset  . Cervical cancer Mother   . Hypertension Mother   . Lung cancer Father   . Breast cancer Neg Hx   . Heart failure Neg Hx   . Seizures Neg Hx     Social History:  reports that she has never smoked.  She has never used smokeless tobacco. She reports current alcohol use. She reports that she does not use drugs.   Physical Exam: BP 124/84   Pulse (!) 109   Ht 5' (1.524 m)   Wt 154 lb (69.9 kg)   BMI 30.08 kg/m   Constitutional:  Alert and oriented, No acute distress. HEENT: [place] AT, moist mucus membranes.  Trachea midline, no masses. Cardiovascular: No clubbing, cyanosis, or edema. Respiratory: Normal respiratory effort, no increased work of breathing. Skin: No rashes, bruises or suspicious lesions. Neurologic: Grossly intact, no focal deficits, moving all 4 extremities. Psychiatric: Normal mood and affect.   Pertinent Imaging: Images personally reviewed Results for orders placed during the hospital encounter of 10/25/19  Ultrasound renal complete   Narrative CLINICAL DATA:  Follow-up complex renal cyst.  EXAM: RENAL / URINARY TRACT ULTRASOUND COMPLETE  COMPARISON:  CT scan 03/09/2019  FINDINGS: Right Kidney:  Renal measurements: 10.8 x 4.4 x 4.9 cm = volume: 120.7 mL . Normal renal cortical thickness and echogenicity without focal lesions or hydronephrosis.  Left Kidney:  Renal measurements: 10.5 x 5.7 x 4.3 cm = volume: 135.8 mL. Normal renal cortical thickness and echogenicity. No renal calculi. As demonstrated on the prior CT scan there is a slightly complex cyst in the lower pole region which measures a maximum of 3.0 x 1.9 x 1.7 cm. Septations are noted. No nodularity.  Bladder:  Normal.  Other:  None.  IMPRESSION: 1. 3.0 x 1.9 x 1.7 cm cyst in the lower pole region of left kidney appears stable. The cyst is complicated by septations but no other worrisome findings. This is considered a Bosniak 4F lesion. Recommend repeat ultrasound examination and 1 year to document stability. 2. Normal right kidney. 3. Normal bladder.   Electronically Signed   By: Marijo Sanes M.D.   On: 10/26/2019 08:22     Assessment & Plan:    - Renal cyst Follow-up  renal ultrasound 1 year  - Overactive bladder New problem.  Symptoms are bothersome and will send in an anticholinergic Rx to her pharmacy.  Call back if medication not effective.   Abbie Sons, [place] [address] [place], [place] [address] [phone]

## 2020-05-20 IMAGING — US US RENAL
1 series · 14 of 25 positions shown · non-contrast
Comparison: CT scan 03/09/2019

CLINICAL DATA: Follow-up complex renal cyst.

EXAM:
RENAL / URINARY TRACT ULTRASOUND COMPLETE

[Series 1: us renal · 14 of 29 slices shown]
[im 1/29]
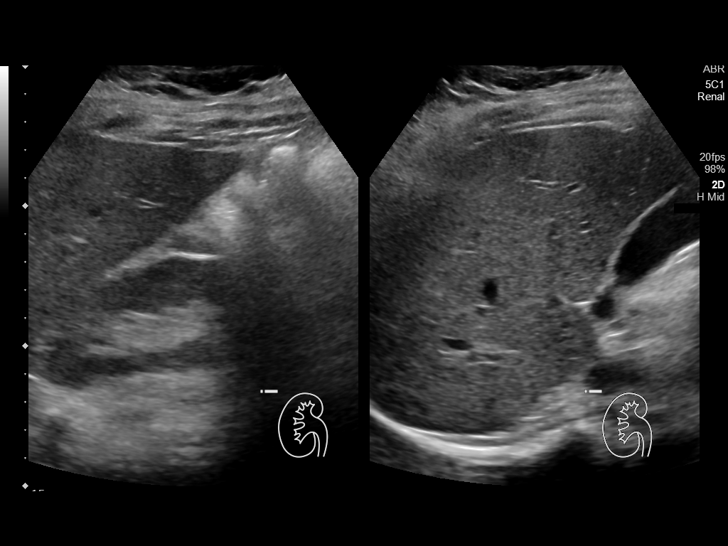
[im 3/29]
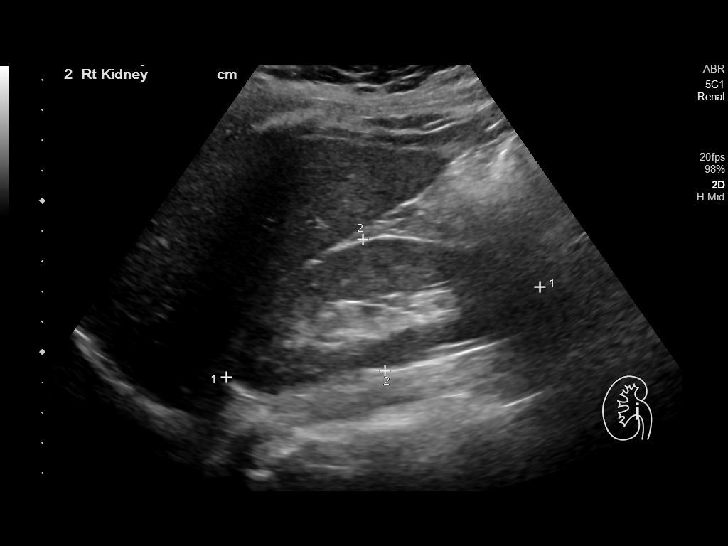
[im 5/29]
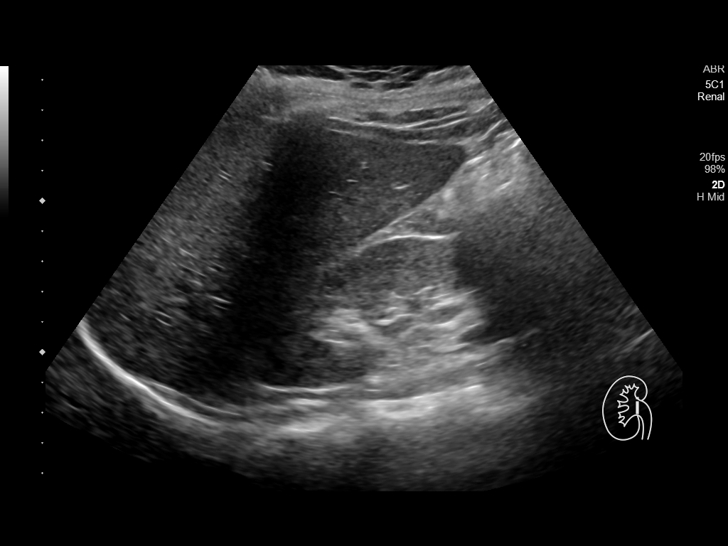
[im 8/29]
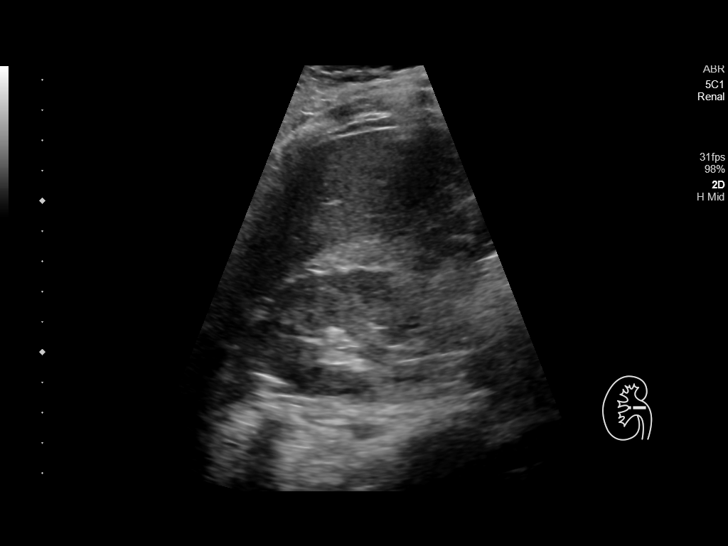
[im 10/29]
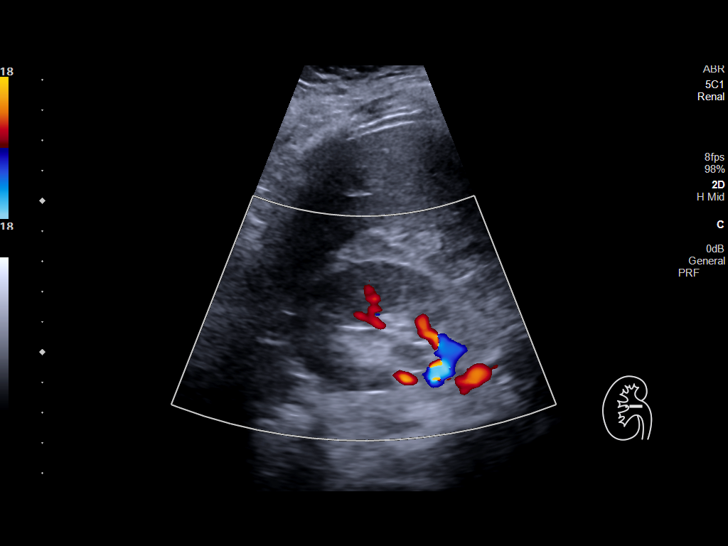
[im 11/29]
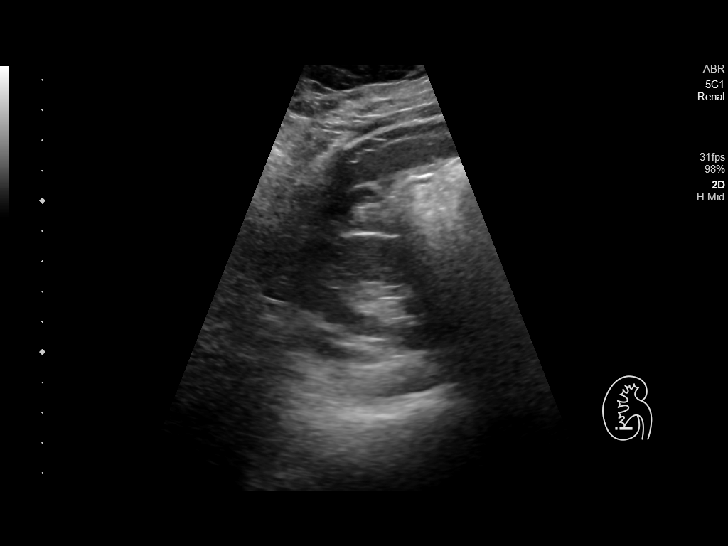
[im 13/29]
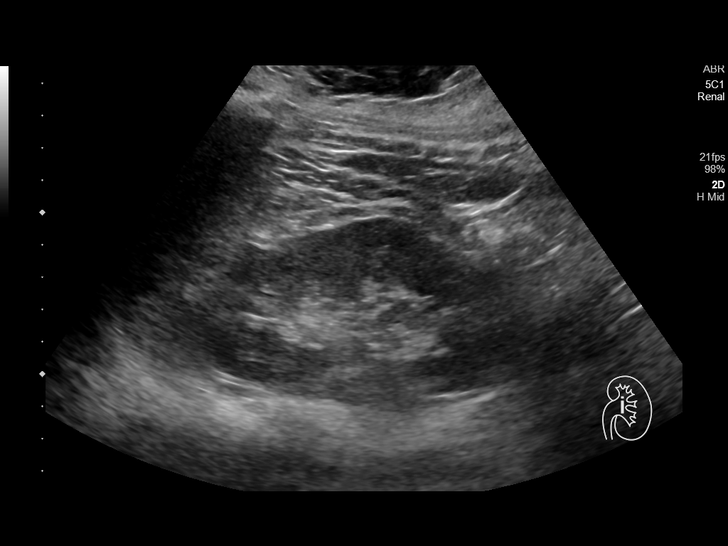
[im 16/29]
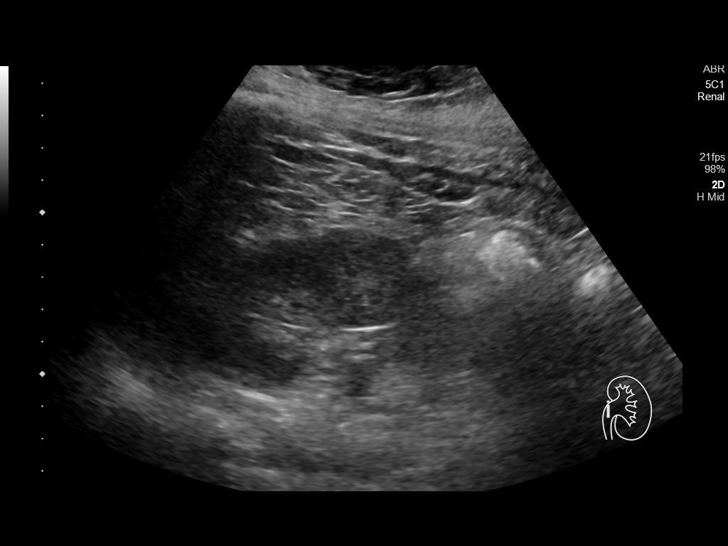
[im 18/29]
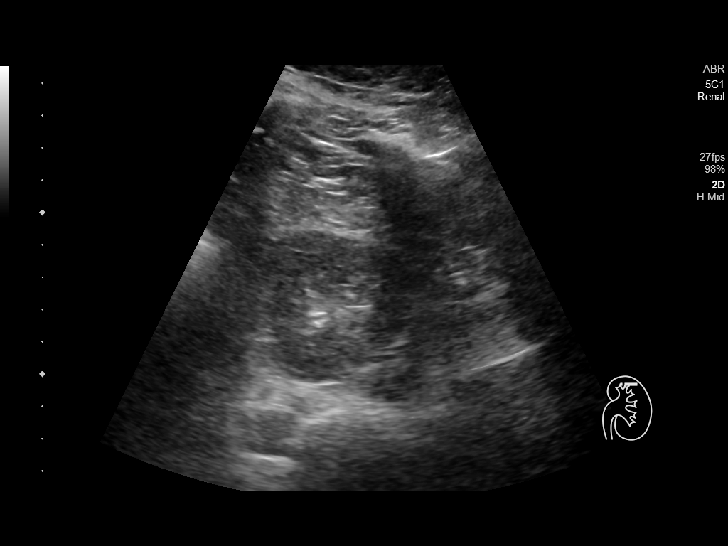
[im 19/29]
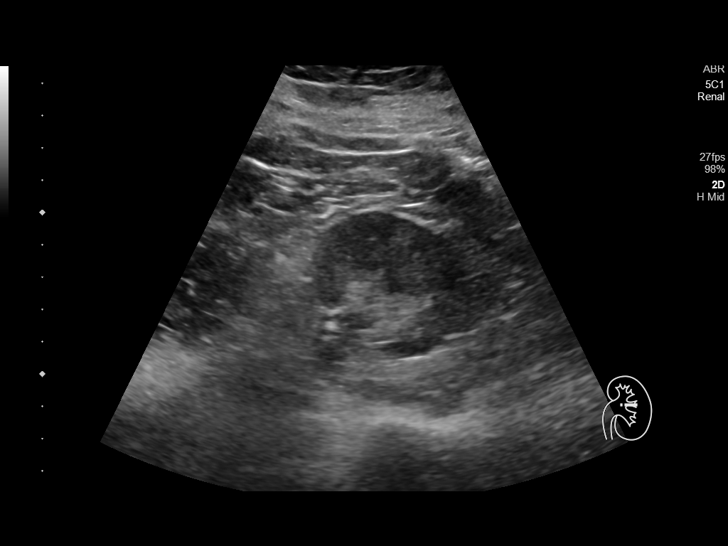
[im 22/29]
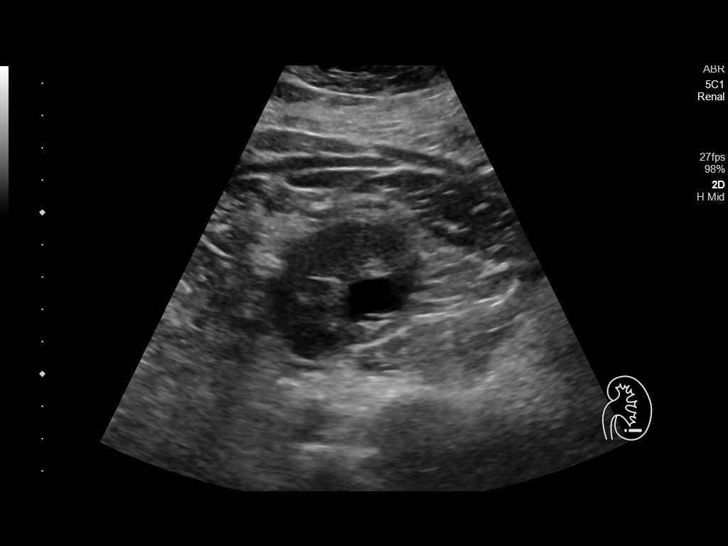
[im 24/29]
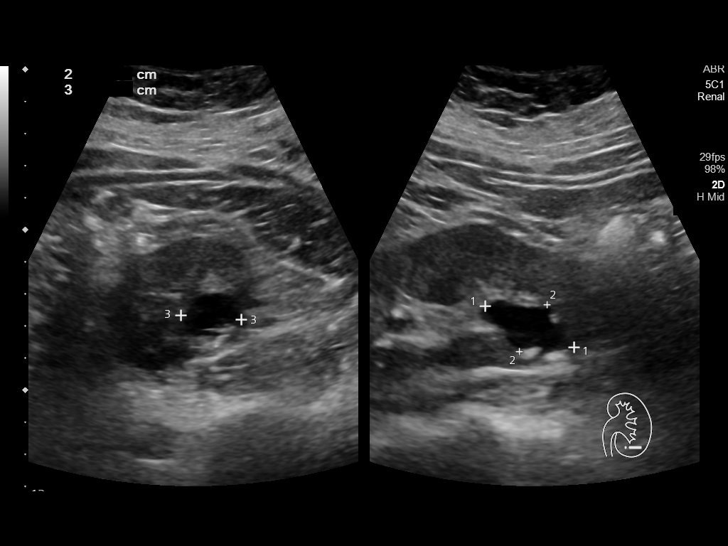
[im 26/29]
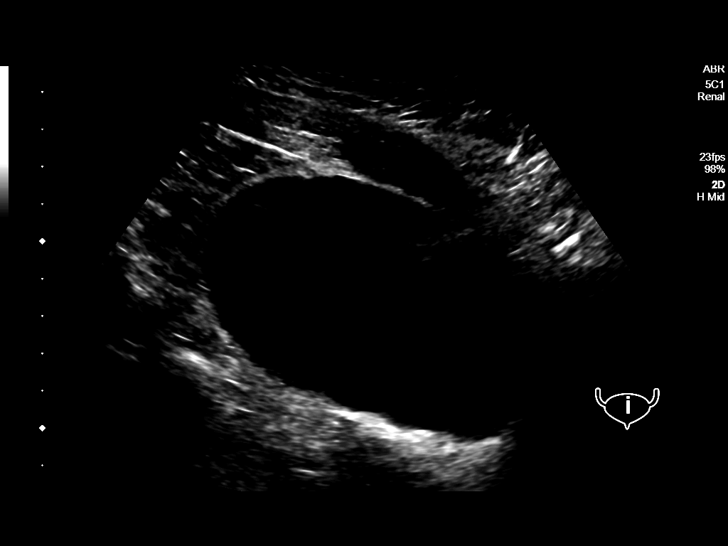
[im 29/29]
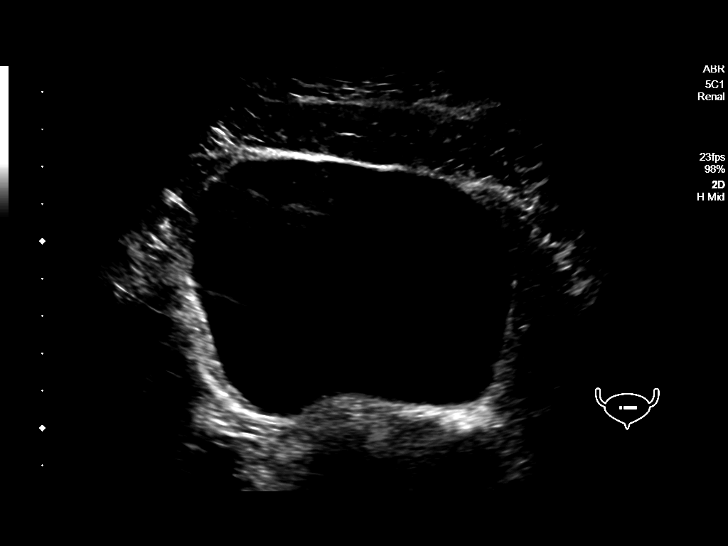

[14 of 25 positions shown; findings below may reference images not displayed]

FINDINGS: Right Kidney:

Renal measurements: 10.8 x 4.4 x 4.9 cm = volume: 120.7 mL . Normal
renal cortical thickness and echogenicity without focal lesions or
hydronephrosis.

Left Kidney:

Renal measurements: 10.5 x 5.7 x 4.3 cm = volume: 135.8 mL. Normal
renal cortical thickness and echogenicity. No renal calculi. As
demonstrated on the prior CT scan there is a slightly complex cyst
in the lower pole region which measures a maximum of 3.0 x 1.9 x
cm. Septations are noted. No nodularity.

Bladder:

Normal.

Other:

None.
IMPRESSION: 1. 3.0 x 1.9 x 1.7 cm cyst in the lower pole region of left kidney
appears stable. The cyst is complicated by septations but no other
worrisome findings. This is considered a Bosniak 2F lesion.
Recommend repeat ultrasound examination and 1 year to document
stability.
2. Normal right kidney.
3. Normal bladder.

## 2020-11-02 ENCOUNTER — Other Ambulatory Visit: Payer: Self-pay

## 2020-11-02 ENCOUNTER — Ambulatory Visit
Admission: RE | Admit: 2020-11-02 | Discharge: 2020-11-02 | Disposition: A | Discharge status: Home / Self Care | Payer: BC Managed Care – PPO | Source: Ambulatory Visit | Attending: Urology | Admitting: Urology

## 2020-11-02 DIAGNOSIS — N281 Cyst of kidney, acquired: Secondary | ICD-10-CM | POA: Diagnosis not present

## 2020-11-06 DIAGNOSIS — R0981 Nasal congestion: Secondary | ICD-10-CM | POA: Diagnosis not present

## 2020-11-06 DIAGNOSIS — J309 Allergic rhinitis, unspecified: Secondary | ICD-10-CM | POA: Diagnosis not present

## 2020-11-19 DIAGNOSIS — J301 Allergic rhinitis due to pollen: Secondary | ICD-10-CM | POA: Diagnosis not present

## 2020-11-22 ENCOUNTER — Encounter: Payer: Self-pay | Admitting: Urology

## 2020-11-22 ENCOUNTER — Ambulatory Visit: Payer: BC Managed Care – PPO | Admitting: Urology

## 2020-11-22 ENCOUNTER — Other Ambulatory Visit: Payer: Self-pay

## 2020-11-22 VITALS — BP 137/85 | HR 109 | Ht 60.0 in | Wt 154.0 lb

## 2020-11-22 DIAGNOSIS — N281 Cyst of kidney, acquired: Secondary | ICD-10-CM | POA: Diagnosis not present

## 2020-11-22 DIAGNOSIS — N3281 Overactive bladder: Secondary | ICD-10-CM

## 2020-11-22 MED ORDER — SOLIFENACIN SUCCINATE 5 MG PO TABS: 5.0000 mg | ORAL_TABLET | Freq: Every day | ORAL | 11 refills | Status: DC

## 2020-11-22 NOTE — Progress Notes (Signed)
11/22/2020 2:45 PM   Donna Perry November 27, 1975 [id number]  Referring provider: Elijah Birk, [address],  [place] [address]  Chief Complaint  Patient presents with  . Follow-up    RUS results    HPI: 45 y.o. female presents for follow-up.   CT 03/2019 showed a 12 mm low-density lesion left kidney felt to represent a Bosniak 2 renal cyst  Follow-up renal ultrasound showed this to be a 3 x 1.9 x 1.7 cm septated renal cyst that was felt to be a Bosniak 61F lesion  Presents for annual follow-up and states she is doing well  Remains on Solifenacin for overactive bladder with stable voiding symptoms  Follow-up renal ultrasound 11/02/2020 remarkable for a 1.8 x 1.4 x 1.4 anechoic left lower pole cyst felt consistent with a simple renal cyst   PMH: Past Medical History:  Diagnosis Date  . Anemia   . Hypertension   . Seasonal allergies     Surgical History: Past Surgical History:  Procedure Laterality Date  . ESSURE TUBAL LIGATION    . HYSTEROSCOPY WITH D & C N/A 02/25/2019   Procedure: DILATATION AND CURETTAGE /HYSTEROSCOPY;  Surgeon: Rubie Maid, MD;  Location: [facility] ORS;  Service: Gynecology;  Laterality: N/A;  . LAPAROSCOPIC BILATERAL SALPINGECTOMY Bilateral 02/25/2019   Procedure: LAPAROSCOPIC BILATERAL SALPINGECTOMY, REMOVAL OF ESSURE COIL,CYSTOSCOPY;  Surgeon: Rubie Maid, MD;  Location: [facility] OR[facility];  Service: Gynecology;  Laterality: Bilateral;  . LEEP    . LEG SURGERY Left     Home Medications:  Allergies as of 11/22/2020      Reactions   Elemental Sulfur    Sulfamethoxazole-trimethoprim Hives      Medication List       Accurate as of November 22, 2020  2:45 PM. If you have any questions, ask your nurse or doctor.        STOP taking these medications   acidophilus Caps capsule Stopped by: Abbie Sons, MD   simethicone 80 MG chewable tablet Commonly known as: Gas-X Stopped by: Abbie Sons, MD   Turmeric 500 MG Caps Stopped by: Abbie Sons, MD     TAKE these medications   CALCIUM-VITAMIN D PO Take 1 tablet by mouth daily.   docusate sodium 100 MG capsule Commonly known as: COLACE Take 1 capsule (100 mg total) by mouth 2 (two) times daily as needed.   ELDERBERRY PO Take 5 mLs by mouth daily.   EPINEPHrine 0.3 mg/0.3 mL Soaj injection Commonly known as: EPI-PEN Inject 0.3 mg into the muscle as needed for anaphylaxis.   ferrous sulfate 325 (65 FE) MG EC tablet Take 325 mg by mouth 2 (two) times a day.   fexofenadine 180 MG tablet Commonly known as: ALLEGRA Take 180 mg by mouth.   fluticasone 50 MCG/ACT nasal spray Commonly known as: FLONASE Place 1 spray into both nostrils daily as needed for allergies.   hydrochlorothiazide 25 MG tablet Commonly known as: HYDRODIURIL Take 25 mg by mouth daily.   ibuprofen 800 MG tablet Commonly known as: ADVIL Take 1 tablet (800 mg total) by mouth every 8 (eight) hours as needed.   levocetirizine 5 MG tablet Commonly known as: XYZAL Take 5 mg by mouth every evening.   lisinopril 5 MG tablet Commonly known as: ZESTRIL Take 5 mg by mouth daily.   loratadine 10 MG tablet Commonly known as: CLARITIN Take 10 mg by mouth daily.   nystatin powder Commonly known as: MYCOSTATIN/NYSTOP Apply 1  Bottle topically 2 (two) times daily as needed (IRRITATION).   Potassium 99 MG Tabs Take 99 mg by mouth daily as needed (POTASSIUM LEVELS).   solifenacin 5 MG tablet Commonly known as: VESICARE Take 1 tablet (5 mg total) by mouth daily.       Allergies:  Allergies  Allergen Reactions  . Elemental Sulfur   . Sulfamethoxazole-Trimethoprim Hives    Family History: Family History  Problem Relation Age of Onset  . Cervical cancer Mother   . Hypertension Mother   . Lung cancer Father   . Breast cancer Neg Hx   . Heart failure Neg Hx   . Seizures Neg Hx     Social History:  reports that she has never smoked. She has never used smokeless tobacco. She reports  current alcohol use. She reports that she does not use drugs.   Physical Exam: BP 137/85   Pulse (!) 109   Ht 5' (1.524 m)   Wt 154 lb (69.9 kg)   BMI 30.08 kg/m   Constitutional:  Alert and oriented, No acute distress. HEENT: [place] AT, moist mucus membranes.  Trachea midline, no masses. Cardiovascular: No clubbing, cyanosis, or edema. Respiratory: Normal respiratory effort, no increased work of breathing.  Pertinent Imaging: Images personally reviewed and interpreted  Ultrasound renal complete  Narrative CLINICAL DATA:  Complex renal cyst.  EXAM: RENAL / URINARY TRACT ULTRASOUND COMPLETE  COMPARISON:  October 25, 2019  FINDINGS: Right Kidney:  Renal measurements: 10.2 cm x 4.7 cm x 4.9 cm = volume: 122.1 mL. Echogenicity within normal limits. No mass or hydronephrosis visualized.  Left Kidney:  Renal measurements: 10.0 cm x 6.2 cm x 4.6 cm = volume: 148.1 mL. Echogenicity within normal limits. A 1.8 cm x 1.4 cm x 1.4 cm anechoic structure is seen within the lower pole of the left kidney. No abnormal flow is seen within this region on color Doppler evaluation. No hydronephrosis is visualized.  Bladder:  Appears normal for degree of bladder distention.  Other:  None.  IMPRESSION: Simple left renal cyst.   Electronically Signed By: Virgina Norfolk M.D. On: 11/04/2020 23:16  No results found for this or any previous visit.  No results found for this or any previous visit.  No results found for this or any previous visit.   Assessment & Plan:    1.  Left renal cyst  Prior CT and renal ultrasounds were reviewed  On my review this appears to be a nonenhancing simple renal cyst on CT with and without contrast.  No septations are identified on CT which is more sensitive than ultrasound  Would not recommend additional routine follow-up imaging  2.  Overactive bladder  Stable  Solifenacin refilled  Annual follow-up   Abbie Sons,  MD  [facility] [address] [place], [place] [address] [phone]

## 2020-11-23 ENCOUNTER — Encounter: Payer: Self-pay | Admitting: Urology

## 2020-11-27 DIAGNOSIS — J301 Allergic rhinitis due to pollen: Secondary | ICD-10-CM | POA: Diagnosis not present

## 2020-11-27 DIAGNOSIS — J309 Allergic rhinitis, unspecified: Secondary | ICD-10-CM | POA: Diagnosis not present

## 2020-12-03 DIAGNOSIS — J301 Allergic rhinitis due to pollen: Secondary | ICD-10-CM | POA: Diagnosis not present

## 2020-12-06 DIAGNOSIS — J301 Allergic rhinitis due to pollen: Secondary | ICD-10-CM | POA: Diagnosis not present

## 2020-12-10 DIAGNOSIS — J301 Allergic rhinitis due to pollen: Secondary | ICD-10-CM | POA: Diagnosis not present

## 2020-12-13 DIAGNOSIS — J301 Allergic rhinitis due to pollen: Secondary | ICD-10-CM | POA: Diagnosis not present

## 2020-12-14 DIAGNOSIS — J301 Allergic rhinitis due to pollen: Secondary | ICD-10-CM | POA: Diagnosis not present

## 2020-12-17 DIAGNOSIS — J301 Allergic rhinitis due to pollen: Secondary | ICD-10-CM | POA: Diagnosis not present

## 2020-12-20 DIAGNOSIS — J301 Allergic rhinitis due to pollen: Secondary | ICD-10-CM | POA: Diagnosis not present

## 2020-12-24 DIAGNOSIS — J301 Allergic rhinitis due to pollen: Secondary | ICD-10-CM | POA: Diagnosis not present

## 2020-12-27 DIAGNOSIS — J301 Allergic rhinitis due to pollen: Secondary | ICD-10-CM | POA: Diagnosis not present

## 2021-01-03 DIAGNOSIS — J301 Allergic rhinitis due to pollen: Secondary | ICD-10-CM | POA: Diagnosis not present

## 2021-01-07 DIAGNOSIS — J301 Allergic rhinitis due to pollen: Secondary | ICD-10-CM | POA: Diagnosis not present

## 2021-01-10 DIAGNOSIS — J301 Allergic rhinitis due to pollen: Secondary | ICD-10-CM | POA: Diagnosis not present

## 2021-01-11 DIAGNOSIS — J301 Allergic rhinitis due to pollen: Secondary | ICD-10-CM | POA: Diagnosis not present

## 2021-01-14 DIAGNOSIS — J301 Allergic rhinitis due to pollen: Secondary | ICD-10-CM | POA: Diagnosis not present

## 2021-01-21 DIAGNOSIS — J301 Allergic rhinitis due to pollen: Secondary | ICD-10-CM | POA: Diagnosis not present

## 2021-01-24 DIAGNOSIS — J301 Allergic rhinitis due to pollen: Secondary | ICD-10-CM | POA: Diagnosis not present

## 2021-01-28 DIAGNOSIS — J301 Allergic rhinitis due to pollen: Secondary | ICD-10-CM | POA: Diagnosis not present

## 2021-01-31 DIAGNOSIS — J301 Allergic rhinitis due to pollen: Secondary | ICD-10-CM | POA: Diagnosis not present

## 2021-02-07 ENCOUNTER — Other Ambulatory Visit: Payer: Self-pay

## 2021-02-07 ENCOUNTER — Encounter: Payer: Self-pay | Admitting: Emergency Medicine

## 2021-02-07 ENCOUNTER — Emergency Department
Admission: EM | Admit: 2021-02-07 | Discharge: 2021-02-08 | Disposition: A | Discharge status: Home / Self Care | Payer: BC Managed Care – PPO | Attending: Emergency Medicine | Admitting: Emergency Medicine

## 2021-02-07 DIAGNOSIS — Z79899 Other long term (current) drug therapy: Secondary | ICD-10-CM | POA: Insufficient documentation

## 2021-02-07 DIAGNOSIS — R531 Weakness: Secondary | ICD-10-CM | POA: Diagnosis not present

## 2021-02-07 DIAGNOSIS — R000 Tachycardia, unspecified: Secondary | ICD-10-CM | POA: Diagnosis not present

## 2021-02-07 DIAGNOSIS — N924 Excessive bleeding in the premenopausal period: Secondary | ICD-10-CM

## 2021-02-07 DIAGNOSIS — R002 Palpitations: Secondary | ICD-10-CM | POA: Insufficient documentation

## 2021-02-07 DIAGNOSIS — I10 Essential (primary) hypertension: Secondary | ICD-10-CM | POA: Diagnosis not present

## 2021-02-07 DIAGNOSIS — D649 Anemia, unspecified: Secondary | ICD-10-CM | POA: Insufficient documentation

## 2021-02-07 DIAGNOSIS — Z3202 Encounter for pregnancy test, result negative: Secondary | ICD-10-CM | POA: Diagnosis not present

## 2021-02-07 DIAGNOSIS — J301 Allergic rhinitis due to pollen: Secondary | ICD-10-CM | POA: Diagnosis not present

## 2021-02-07 DIAGNOSIS — N920 Excessive and frequent menstruation with regular cycle: Secondary | ICD-10-CM | POA: Insufficient documentation

## 2021-02-07 LAB — CBC
HCT: 23.8 % — ABNORMAL LOW (ref 36.0–46.0)
Hemoglobin: 7.7 g/dL — ABNORMAL LOW (ref 12.0–15.0)
MCH: 26.2 pg (ref 26.0–34.0)
MCHC: 32.4 g/dL (ref 30.0–36.0)
MCV: 81 fL (ref 80.0–100.0)
Platelets: 414 K/uL — ABNORMAL HIGH (ref 150–400)
RBC: 2.94 MIL/uL — ABNORMAL LOW (ref 3.87–5.11)
RDW: 16 % — ABNORMAL HIGH (ref 11.5–15.5)
WBC: 8.2 K/uL (ref 4.0–10.5)
nRBC: 0 % (ref 0.0–0.2)

## 2021-02-07 LAB — URINALYSIS, COMPLETE (UACMP) WITH MICROSCOPIC
Bacteria, UA: NONE SEEN
Bilirubin Urine: NEGATIVE
Glucose, UA: NEGATIVE mg/dL
Hgb urine dipstick: NEGATIVE
Ketones, ur: NEGATIVE mg/dL
Leukocytes,Ua: NEGATIVE
Nitrite: NEGATIVE
Protein, ur: NEGATIVE mg/dL
Specific Gravity, Urine: 1.004 — ABNORMAL LOW (ref 1.005–1.030)
pH: 8 (ref 5.0–8.0)

## 2021-02-07 LAB — BASIC METABOLIC PANEL
Anion gap: 8 (ref 5–15)
BUN: 8 mg/dL (ref 6–20)
CO2: 29 mmol/L (ref 22–32)
Calcium: 9 mg/dL (ref 8.9–10.3)
Chloride: 103 mmol/L (ref 98–111)
Creatinine, Ser: 0.5 mg/dL (ref 0.44–1.00)
GFR, Estimated: >60 mL/min (ref >60)
Glucose, Bld: 97 mg/dL (ref 70–99)
Potassium: 4 mmol/L (ref 3.5–5.1)
Sodium: 140 mmol/L (ref 135–145)

## 2021-02-07 LAB — POC URINE PREG, ED: Preg Test, Ur: NEGATIVE

## 2021-02-07 MED ORDER — SODIUM CHLORIDE 0.9 % IV SOLN
10.0000 mL/h | Freq: Once | INTRAVENOUS | Status: AC
  Administered 2021-02-08: 10 mL/h via INTRAVENOUS

## 2021-02-07 NOTE — ED Provider Notes (Signed)
[facility] Emergency Department Provider Note  Time seen: 11:38 PM  I have reviewed the triage vital signs and the nursing notes.   HISTORY  Chief Complaint Palpitations   HPI Donna Perry is a 45 y.o. female with a past medical history of anemia, hypertension, presents to the emergency department for weakness and palpitations.  According to the patient she had her menstrual cycle started on Saturday had very heavy bleeding Saturday and Sunday.  Over the weekend multiple times she felt like she was going to pass out, has been experiencing shortness of breath with minimal exertion ever since as well as palpitations at times where she feels like her heart is racing.  Patient denies any chest pain.  Denies any abdominal pain.  States her menstrual cycle stopped 2 days ago.  Patient states a history of heavy menstrual cycles in the past, follows up with Dr. Marcelline Mates did require a D&C in the past for heavy bleeding.   Past Medical History:  Diagnosis Date   Anemia    Hypertension    Seasonal allergies     Patient Active Problem List   Diagnosis Date Noted   Acquired complex renal cyst 11/23/2019   Overactive bladder 11/23/2019   High blood pressure 04/13/2019   Displaced fracture of anterior process of left calcaneus, initial encounter for closed fracture 04/08/2017   Myofascial pain 04/11/2016    Past Surgical History:  Procedure Laterality Date   ESSURE TUBAL LIGATION     HYSTEROSCOPY WITH D & C N/A 02/25/2019   Procedure: DILATATION AND CURETTAGE /HYSTEROSCOPY;  Surgeon: Rubie Maid, MD;  Location: [facility] ORS;  Service: Gynecology;  Laterality: N/A;   LAPAROSCOPIC BILATERAL SALPINGECTOMY Bilateral 02/25/2019   Procedure: LAPAROSCOPIC BILATERAL SALPINGECTOMY, REMOVAL OF ESSURE COIL,CYSTOSCOPY;  Surgeon: Rubie Maid, MD;  Location: [facility] OR[facility];  Service: Gynecology;  Laterality: Bilateral;   LEEP     LEG SURGERY Left     Prior to Admission medications    Medication Sig Start Date End Date Taking? Authorizing Provider  CALCIUM-VITAMIN D PO Take 1 tablet by mouth daily.     [provider]  docusate sodium (COLACE) 100 MG capsule Take 1 capsule (100 mg total) by mouth 2 (two) times daily as needed. 01/20/19   Rubie Maid, MD  ELDERBERRY PO Take 5 mLs by mouth daily.    [provider]  EPINEPHrine 0.3 mg/0.3 mL IJ SOAJ injection Inject 0.3 mg into the muscle as needed for anaphylaxis.  11/16/14   [provider]  ferrous sulfate 325 (65 FE) MG EC tablet Take 325 mg by mouth 2 (two) times a day.     [provider]  fexofenadine (ALLEGRA) 180 MG tablet Take 180 mg by mouth.    [provider]  fluticasone (FLONASE) 50 MCG/ACT nasal spray Place 1 spray into both nostrils daily as needed for allergies.  11/16/14   [provider]  hydrochlorothiazide (HYDRODIURIL) 25 MG tablet Take 25 mg by mouth daily.  04/16/12   [provider]  ibuprofen (ADVIL) 800 MG tablet Take 1 tablet (800 mg total) by mouth every 8 (eight) hours as needed. 02/25/19   Rubie Maid, MD  levocetirizine (XYZAL) 5 MG tablet Take 5 mg by mouth every evening.    [provider]  lisinopril (ZESTRIL) 5 MG tablet Take 5 mg by mouth daily.    [provider]  loratadine (CLARITIN) 10 MG tablet Take 10 mg by mouth daily.    [provider]  nystatin (MYCOSTATIN/NYSTOP) powder Apply 1 Bottle topically 2 (two) times daily as needed (IRRITATION).    [provider]  Potassium 99 MG TABS Take 99 mg by mouth daily as needed (POTASSIUM LEVELS).    [provider]  solifenacin (VESICARE) 5 MG tablet Take 1 tablet (5 mg total) by mouth daily. 11/22/20   Stoioff, Ronda Fairly, MD    Allergies  Allergen Reactions   Elemental Sulfur    Sulfamethoxazole-Trimethoprim Hives    Family History  Problem Relation Age of Onset   Cervical cancer Mother    Hypertension Mother    Lung cancer Father     Breast cancer Neg Hx    Heart failure Neg Hx    Seizures Neg Hx     Social History Social History   Tobacco Use   Smoking status: Never   Smokeless tobacco: Never  Vaping Use   Vaping Use: Never used  Substance Use Topics   Alcohol use: Yes    Comment: Socially   Drug use: No    Review of Systems Constitutional: Negative for fever. Cardiovascular: Negative for chest pain. Respiratory: Negative for shortness of breath. Gastrointestinal: Negative for abdominal pain, vomiting Genitourinary: Heavy vaginal bleeding which is since resolved Musculoskeletal: Negative for musculoskeletal complaints Neurological: Negative for headache All other ROS negative  ____________________________________________   PHYSICAL EXAM:  VITAL SIGNS: ED Triage Vitals [02/07/21 1618]  Enc Vitals Group     BP (!) 127/98     Pulse Rate (!) 101     Resp 18     Temp 98.8 F (37.1 C)     Temp Source Oral     SpO2 100 %     Weight 154 lb 1.6 oz (69.9 kg)     Height 5' (1.524 m)     Head Circumference      Peak Flow      Pain Score 0     Pain Loc      Pain Edu?      Excl. in [place]?    Constitutional: Alert and oriented. Well appearing and in no distress. Eyes: Normal exam ENT      Head: Normocephalic and atraumatic.      Mouth/Throat: Mucous membranes are moist. Cardiovascular: Normal rate, regular rhythm around 100 bpm.  Respiratory: Normal respiratory effort without tachypnea nor retractions. Breath sounds are clear  Gastrointestinal: Soft and nontender. No distention. Musculoskeletal: Nontender with normal range of motion in all extremities.  Neurologic:  Normal speech and language. No gross focal neurologic deficits Skin:  Skin is warm, dry and intact.  Psychiatric: Mood and affect are normal.   ____________________________________________    EKG  EKG viewed and interpreted by myself shows a normal sinus rhythm at 97 bpm with a narrow QRS, normal axis, normal intervals, no  concerning ST changes.  ____________________________________________     INITIAL IMPRESSION / ASSESSMENT AND PLAN / ED COURSE  Pertinent labs & imaging results that were available during my care of the patient were reviewed by me and considered in my medical decision making (see chart for details).   Patient presents to the emergency department for dyspnea with exertion, generalized weakness, palpitations and heart racing at times.  Patient states heavy vaginal bleeding over the weekend, has a history of heavy menstrual cycles in the past.  Patient's hemoglobin today 7.7 down from 11.9 1-year ago.  Given the patient's symptomatic anemia I do believe she would benefit from transfusion.  I spoke to the patient regarding the  risk and benefits of transfusion and she is agreeable to plan of care.  Patient is taking 2 forms of iron supplements at home as well.  Patient follows up with Dr. Marcelline Mates, we will speak with Dr. Marcelline Mates anticipate likely transfusion in the emergency department with discharge and outpatient follow-up for more definitive treatment as the patient is no longer bleeding currently.  Patient has finished a blood transfusion.  I was unable to reach the on-call physician for Dr. Marcelline Mates this evening.  However as the patient is no longer bleeding x2 days and has a follow-up appointment next week I believe she is safe for discharge home.  Patient is to call Dr. Marcelline Mates in the morning.  Donna Perry was evaluated in Emergency Department on 02/07/2021 for the symptoms described in the history of present illness. She was evaluated in the context of the global COVID-19 pandemic, which necessitated consideration that the patient might be at risk for infection with the SARS-CoV-2 virus that causes COVID-19. Institutional protocols and algorithms that pertain to the evaluation of patients at risk for COVID-19 are in a state of rapid change based on information released by regulatory bodies including the CDC  and federal and state organizations. These policies and algorithms were followed during the patient's care in the ED.  ____________________________________________   FINAL CLINICAL IMPRESSION(S) / ED DIAGNOSES  Symptomatic anemia Menorrhagia   Harvest Dark, MD 02/08/21 [phone]

## 2021-02-07 NOTE — ED Triage Notes (Signed)
Pt comes into the ED via POV c/o palpitations especially when she exerts herself.  Pt does admit that she had a menstrual cycle a couple days ago that was extensively heavier than normal and after that she was much weaker.  Pt also has a h/o anemia and hypotension.  Pt ambulatory to triage with even and unlabored respirations.

## 2021-02-08 LAB — PREPARE RBC (CROSSMATCH)

## 2021-02-08 LAB — ABO/RH: ABO/RH(D): O POS

## 2021-02-09 LAB — TYPE AND SCREEN
ABO/RH(D): O POS
Antibody Screen: NEGATIVE
Unit division: 0

## 2021-02-09 LAB — BPAM RBC
Blood Product Expiration Date: 202208072359
ISSUE DATE / TIME: 202207080134
Unit Type and Rh: 5100

## 2021-02-11 DIAGNOSIS — J301 Allergic rhinitis due to pollen: Secondary | ICD-10-CM | POA: Diagnosis not present

## 2021-02-14 DIAGNOSIS — J301 Allergic rhinitis due to pollen: Secondary | ICD-10-CM | POA: Diagnosis not present

## 2021-02-18 DIAGNOSIS — J301 Allergic rhinitis due to pollen: Secondary | ICD-10-CM | POA: Diagnosis not present

## 2021-02-18 NOTE — Progress Notes (Signed)
GYNECOLOGY CLINIC PROGRESS NOTE Subjective:     Donna Perry is an 45 y.o. G1P1001 woman who presents for abnormal menses. Last seen in 02/2019 She had been bleeding regularly. Cycles usually are occurring every 28-30 days and menses are lasting 5 days. Notes most recent cycle was abnormal. Patient's last menstrual period was 02/01/2021 (exact date).  Notes bleeding became extremely heavy on Day#2. She changed her pad or tampon every 1 hours. Reported nausea and vomiting episode. Clots are medium to large in size. Reported issues of heart palpitations for several days after the heavy bleeding with dizziness, went to the ER, was noted to be anemic and received a blood transfusion. She does note that over the past few years cycles have been getting a little heavier. Has had 1 other cycle in January that was extremely heavy. Dysmenorrhea:mild, occurring first 1-2 days of flow. Cyclic symptoms include: bloating, fluid retention, and insomnia. Current contraception:  bilateral salpingectomy (after Essure coil removal for pain) . Last pap smear was 01/12/2017. Has a previous history of menometrorrhagia, s/p D&C in 2020.   Also noting a possible yeast infection, started taking Monistat Saturday.    Menstrual History: Menarche age: 31 Patient's last menstrual period was 02/08/2021 (exact date). Period Duration (Days): 5+ Period Pattern: Regular Menstrual Flow: (S) Heavy, Moderate, Light (January heavy menstrual flow with clots) Menstrual Control: Maxi pad Menstrual Control Change Freq (Hours): 1-2 Dysmenorrhea: (!) Mild Dysmenorrhea Symptoms: (S) Cramping, Nausea, Other (Comment) (vomiting)    The following portions of the patient's history were reviewed and updated as appropriate: She  has a past medical history of Anemia, Hypertension, and Seasonal allergies. She  has a past surgical history that includes Essure tubal ligation; LEEP; Leg Surgery (Left); Laparoscopic bilateral salpingectomy  (Bilateral, 02/25/2019); and Hysteroscopy with D & C (N/A, 02/25/2019). Her family history includes Cervical cancer in her mother; Hypertension in her mother; Lung cancer in her father. She  reports that she has never smoked. She has never used smokeless tobacco. She reports current alcohol use. She reports that she does not use drugs. She has a current medication list which includes the following prescription(s): apple cider vinegar, cyanocobalamin, docusate sodium, elderberry, epinephrine, ferrous sulfate, fexofenadine, fluticasone, hydrochlorothiazide, ibuprofen, levocetirizine, lisinopril, loratadine, nystatin, potassium, and solifenacin. Current Outpatient Medications on File Prior to Visit  Medication Sig Dispense Refill   APPLE CIDER VINEGAR PO Take by mouth.     Cyanocobalamin (B-12 PO) Take by mouth.     docusate sodium (COLACE) 100 MG capsule Take 1 capsule (100 mg total) by mouth 2 (two) times daily as needed. 30 capsule 2   ELDERBERRY PO Take 5 mLs by mouth daily.     EPINEPHrine 0.3 mg/0.3 mL IJ SOAJ injection Inject 0.3 mg into the muscle as needed for anaphylaxis.      ferrous sulfate 325 (65 FE) MG EC tablet Take 325 mg by mouth 2 (two) times a day.      fexofenadine (ALLEGRA) 180 MG tablet Take 180 mg by mouth.     fluticasone (FLONASE) 50 MCG/ACT nasal spray Place 1 spray into both nostrils daily as needed for allergies.      hydrochlorothiazide (HYDRODIURIL) 25 MG tablet Take 25 mg by mouth daily.      ibuprofen (ADVIL) 800 MG tablet Take 1 tablet (800 mg total) by mouth every 8 (eight) hours as needed. 30 tablet 1   levocetirizine (XYZAL) 5 MG tablet Take 5 mg by mouth every evening.  lisinopril (ZESTRIL) 5 MG tablet Take 5 mg by mouth daily.     loratadine (CLARITIN) 10 MG tablet Take 10 mg by mouth daily.     nystatin (MYCOSTATIN/NYSTOP) powder Apply 1 Bottle topically 2 (two) times daily as needed (IRRITATION).     Potassium 99 MG TABS Take 99 mg by mouth daily as needed  (POTASSIUM LEVELS).     solifenacin (VESICARE) 5 MG tablet Take 1 tablet (5 mg total) by mouth daily. (Patient taking differently: Take 5 mg by mouth daily. As needed) 30 tablet 11   No current facility-administered medications on file prior to visit.   She is allergic to elemental sulfur and sulfamethoxazole-trimethoprim..  Review of Systems Pertinent items noted in HPI and remainder of comprehensive ROS otherwise negative.    Objective:    BP (!) 152/84   Pulse (!) 108   Ht 5' (1.524 m)   Wt 162 lb 8 oz (73.7 kg)   LMP 02/01/2021 (Exact Date)   BMI 31.74 kg/m   General:   alert, cooperative, appears stated age, and no distress  Skin:    normal and no rash or abnormalities  Neck:  no adenopathy, no carotid bruit, no JVD, supple, symmetrical, trachea midline, and thyroid not enlarged, symmetric, no tenderness/mass/nodules  Abdomen:  soft, non-tender; bowel sounds normal; no masses,  no organomegaly  Pelvic:   external genitalia normal, rectovaginal septum normal.  Vagina with small amount of  discharge.  Cervix unable to be visualized on speculum, displaced posteriorly, palpable with no lesions or tenderness.  Uterus feels slightly enlarged ~ 12-14 week size, posterior smooth round mass palpable beyond cervix, non-tender.  Adnexae non-palpable, nontender bilaterally.         Labs:   Lab Results  Component Value Date   WBC 8.2 02/07/2021   HGB 7.7 (L) 02/07/2021   HCT 23.8 (L) 02/07/2021   MCV 81.0 02/07/2021   PLT 414 (H) 02/07/2021    Assessment:   1. Menorrhagia with regular cycle   2. Pelvic mass   3. Vaginal yeast infection   4. Pap smear for cervical cancer screening   5. Iron deficiency anemia due to chronic blood loss     Plan:   - Diagnosis explained in detail, including differential.  Suspected fibroid uterus. Briefly discussed management options if fibroid uterus diagnosed. If surgical intervention desired, will need to have endometrial biopsy performed.   - Pap smear performed for screening as she is overdue.  Blind smear performed as unable to visualize cervix due to displacement posteriorly. - Pelvic ultrasound ordered to assess pelvic mass. - TSH ordered for heavy uterine bleeding.  - Anemia, currently taking daily iron supplement. Has received recent blood transfusion. Will recheck levels at next visit.  - Continue Monistat for yeast infection.  - To return in 3-4 weeks after ultrasound.    A total of 25 minutes were spent face-to-face with the patient during this encounter and over half of that time dealt with counseling and coordination of care.  Rubie Maid, MD [facility]

## 2021-02-19 ENCOUNTER — Ambulatory Visit: Payer: BC Managed Care – PPO | Admitting: Obstetrics and Gynecology

## 2021-02-19 ENCOUNTER — Encounter: Payer: Self-pay | Admitting: Obstetrics and Gynecology

## 2021-02-19 ENCOUNTER — Other Ambulatory Visit: Payer: Self-pay

## 2021-02-19 ENCOUNTER — Other Ambulatory Visit (HOSPITAL_COMMUNITY)
Admission: RE | Admit: 2021-02-19 | Discharge: 2021-02-19 | Disposition: A | Discharge status: Home / Self Care | Payer: BC Managed Care – PPO | Source: Ambulatory Visit | Attending: Obstetrics and Gynecology | Admitting: Obstetrics and Gynecology

## 2021-02-19 VITALS — BP 152/84 | HR 108 | Ht 60.0 in | Wt 162.5 lb

## 2021-02-19 DIAGNOSIS — R1900 Intra-abdominal and pelvic swelling, mass and lump, unspecified site: Secondary | ICD-10-CM | POA: Diagnosis not present

## 2021-02-19 DIAGNOSIS — Z124 Encounter for screening for malignant neoplasm of cervix: Secondary | ICD-10-CM | POA: Insufficient documentation

## 2021-02-19 DIAGNOSIS — B373 Candidiasis of vulva and vagina: Secondary | ICD-10-CM | POA: Diagnosis not present

## 2021-02-19 DIAGNOSIS — B3731 Acute candidiasis of vulva and vagina: Secondary | ICD-10-CM

## 2021-02-19 DIAGNOSIS — D500 Iron deficiency anemia secondary to blood loss (chronic): Secondary | ICD-10-CM

## 2021-02-19 DIAGNOSIS — N920 Excessive and frequent menstruation with regular cycle: Secondary | ICD-10-CM

## 2021-02-19 MED ORDER — TRANEXAMIC ACID 650 MG PO TABS: 1300.0000 mg | ORAL_TABLET | Freq: Three times a day (TID) | ORAL | 2 refills | Status: DC

## 2021-02-19 NOTE — Patient Instructions (Signed)
Dysfunctional Uterine Bleeding Dysfunctional uterine bleeding is abnormal bleeding from the uterus. Dysfunctional uterine bleeding includes: A menstrual period that comes earlier or later than usual. A menstrual period that is lighter or heavier than usual, or has large blood clots. Vaginal bleeding between menstrual periods. Skipping one or more menstrual periods. Vaginal bleeding after sex. Vaginal bleeding after menopause. Follow these instructions at home: Eating and drinking  Eat well-balanced meals. Include foods that are high in iron, such as liver, meat, shellfish, green leafy vegetables, and eggs. To prevent or treat constipation, your health care provider may recommend that you: Drink enough fluid to keep your urine pale yellow. Take over-the-counter or prescription medicines. Eat foods that are high in fiber, such as beans, whole grains, and fresh fruits and vegetables. Limit foods that are high in fat and processed sugars, such as fried or sweet foods. Medicines Take over-the-counter and prescription medicines only as told by your health care provider. Do not change medicines without talking with your health care provider. Aspirin or medicines that contain aspirin may make the bleeding worse. Do not take those medicines: During the week before your menstrual period. During your menstrual period. If you were prescribed iron pills, take them as told by your health care provider. Iron pills help to replace iron that your body loses because of this condition. Activity If you need to change your sanitary pad or tampon more than one time every 2 hours: Lie in bed with your feet raised (elevated). Place a cold pack on your lower abdomen. Rest as much as possible until the bleeding stops or slows down. Do not try to lose weight until the bleeding has stopped and your blood iron level is back to normal. General instructions  For two months, write down: When your menstrual period  starts. When your menstrual period ends. When any abnormal vaginal bleeding occurs. What problems you notice. Keep all follow up visits as told by your health care provider. This is important. Contact a health care provider if you: Feel light-headed or weak. Have nausea and vomiting. Cannot eat or drink without vomiting. Feel dizzy or have diarrhea while you are taking medicines. Are taking birth control pills or hormones, and you want to change them or stop taking them. Get help right away if: You develop a fever or chills. You need to change your sanitary pad or tampon more than one time per hour. Your vaginal bleeding becomes heavier, or your flow contains clots more often. You develop pain in your abdomen. You lose consciousness. You develop a rash. Summary Dysfunctional uterine bleeding is abnormal bleeding from the uterus. It includes menstrual bleeding of abnormal duration, volume, or regularity. Bleeding after sex and after menopause are also considered dysfunctional uterine bleeding. This information is not intended to replace advice given to you by your health care provider. Make sure you discuss any questions you have with your health care provider. Document Revised: 12/30/2017 Document Reviewed: 12/30/2017 Elsevier Patient Education  2022 [employer]  

## 2021-02-20 LAB — TSH: TSH: 0.764 u[IU]/mL (ref 0.450–4.500)

## 2021-02-21 DIAGNOSIS — J301 Allergic rhinitis due to pollen: Secondary | ICD-10-CM | POA: Diagnosis not present

## 2021-02-25 DIAGNOSIS — J301 Allergic rhinitis due to pollen: Secondary | ICD-10-CM | POA: Diagnosis not present

## 2021-02-26 LAB — CYTOLOGY - PAP
Comment: NEGATIVE
Diagnosis: NEGATIVE
High risk HPV: NEGATIVE

## 2021-02-28 DIAGNOSIS — J301 Allergic rhinitis due to pollen: Secondary | ICD-10-CM | POA: Diagnosis not present

## 2021-03-01 DIAGNOSIS — J301 Allergic rhinitis due to pollen: Secondary | ICD-10-CM | POA: Diagnosis not present

## 2021-03-04 DIAGNOSIS — J301 Allergic rhinitis due to pollen: Secondary | ICD-10-CM | POA: Diagnosis not present

## 2021-03-07 DIAGNOSIS — J301 Allergic rhinitis due to pollen: Secondary | ICD-10-CM | POA: Diagnosis not present

## 2021-03-11 DIAGNOSIS — J301 Allergic rhinitis due to pollen: Secondary | ICD-10-CM | POA: Diagnosis not present

## 2021-03-14 DIAGNOSIS — J301 Allergic rhinitis due to pollen: Secondary | ICD-10-CM | POA: Diagnosis not present

## 2021-03-15 ENCOUNTER — Ambulatory Visit
Admission: RE | Admit: 2021-03-15 | Discharge: 2021-03-15 | Disposition: A | Discharge status: Home / Self Care | Payer: BC Managed Care – PPO | Source: Ambulatory Visit | Attending: Obstetrics and Gynecology | Admitting: Obstetrics and Gynecology

## 2021-03-15 ENCOUNTER — Other Ambulatory Visit: Payer: Self-pay

## 2021-03-15 DIAGNOSIS — R1900 Intra-abdominal and pelvic swelling, mass and lump, unspecified site: Secondary | ICD-10-CM

## 2021-03-15 DIAGNOSIS — N920 Excessive and frequent menstruation with regular cycle: Secondary | ICD-10-CM | POA: Insufficient documentation

## 2021-03-15 DIAGNOSIS — N858 Other specified noninflammatory disorders of uterus: Secondary | ICD-10-CM | POA: Diagnosis not present

## 2021-03-15 DIAGNOSIS — N939 Abnormal uterine and vaginal bleeding, unspecified: Secondary | ICD-10-CM | POA: Diagnosis not present

## 2021-03-15 DIAGNOSIS — J301 Allergic rhinitis due to pollen: Secondary | ICD-10-CM | POA: Diagnosis not present

## 2021-03-18 DIAGNOSIS — J301 Allergic rhinitis due to pollen: Secondary | ICD-10-CM | POA: Diagnosis not present

## 2021-03-21 DIAGNOSIS — J301 Allergic rhinitis due to pollen: Secondary | ICD-10-CM | POA: Diagnosis not present

## 2021-03-21 NOTE — Progress Notes (Signed)
GYNECOLOGY PROGRESS NOTE  Subjective:    Patient ID: Donna Perry, female    DOB: 24-Jul-1976, 45 y.o.   MRN: [id number]  HPI  Patient is a 45 y.o. G68P1001 female who presents for follow up on menorrhagia with regular cycles and to discuss ultrasound results (performed 03/15/2021).  She denies complaints today.    The following portions of the patient's history were reviewed and updated as appropriate: allergies, current medications, past family history, past medical history, past social history, past surgical history, and problem list.  Review of Systems Pertinent items noted in HPI and remainder of comprehensive ROS otherwise negative.   Objective:   Blood pressure (!) 142/86, pulse 91, resp. rate 16, height 5' (1.524 m), weight 166 lb 1.6 oz (75.3 kg), last menstrual period 01/26/2021.  Body mass index is 32.44 kg/m. General appearance: alert and cooperative Exam deferred.    Labs:  Lab Results  Component Value Date   WBC 8.2 02/07/2021   HGB 7.7 (L) 02/07/2021   HCT 23.8 (L) 02/07/2021   MCV 81.0 02/07/2021   PLT 414 (H) 02/07/2021      Imaging:  US PELVIS (TRANSABDOMINAL AND TRANSVAGINAL) CLINICAL DATA:  Initial evaluation for dysfunctional uterine bleeding, pelvic mass.  EXAM: TRANSABDOMINAL AND TRANSVAGINAL ULTRASOUND OF PELVIS  TECHNIQUE: Both transabdominal and transvaginal ultrasound examinations of the pelvis were performed. Transabdominal technique was performed for global imaging of the pelvis including uterus, ovaries, adnexal regions, and pelvic cul-de-sac. It was necessary to proceed with endovaginal exam following the transabdominal exam to visualize the uterus, endometrium, and ovaries.  COMPARISON:  Ultrasound from 02/03/2017 and CT from 03/09/2019.  FINDINGS: Uterus  Measurements: 8.3 x 6.3 x 8.6 cm = volume: 233.8 mL. Uterus is retroflexed. Heterogeneous echotexture seen diffusely throughout the uterine myometrium with multiple scattered  echogenic striations, suggesting adenomyosis. No definite discrete fibroid.  Endometrium  Thickness: 7 mm. No focal abnormality. Small volume minimally complex fluid noted within the endometrial cavity.  Right ovary  Not visualized.  No adnexal mass.  Left ovary  Not visualized.  No adnexal mass.  Other findings  Small volume free fluid noted within the pelvis.  IMPRESSION: 1. Endometrial stripe measures 7 mm in thickness with small volume minimally complex fluid within the endometrial cavity. If bleeding remains unresponsive to hormonal or medical therapy, sonohysterogram should be considered for focal lesion work-up. (Ref: Radiological Reasoning: Algorithmic Workup of Abnormal Vaginal Bleeding with Endovaginal Sonography and Sonohysterography. AJR [id number]). 2. Probable uterine adenomyosis.  No definite discrete fibroid. 3. Nonvisualization of either ovary.  No adnexal mass. 4. Small volume free fluid within the pelvis, nonspecific, but most commonly physiologic.  Electronically Signed   By: Jeannine Boga M.D.   On: 03/16/2021 03:03   Assessment:   1. Menorrhagia with regular cycle   2. Adenomyosis   3. Iron deficiency anemia due to chronic blood loss     Plan:   - Reviewed ultrasound results with patient.  No discrete masses noted (fibroids).  Appears that patient may have adenomyosis.  Discussed that although not typically definitively diagnosed by ultrasound, could give major insight into the cause of her bleeding. Discussed management options for heavy uterine bleeding caused by adenomyosis including tranexamic acid (Lysteda), oral progesterone, Depo Provera, Levonogestrel IUD, endometrial ablation or hysterectomy as definitive surgical management. Also could consider trial of Orilissa.  Patient has h/o D&C in 2020 due to menometrorrhagia. Discussed risks and benefits of each method.   Patient desires trial or [facility].  Patient brochure was given to  the patient to review at home.  Orilissa prescribed, bleeding precautions reviewed.   - Continue iron supplementation for iron deficiency anemia.  - Will f/u in 1 month on reassessment of symptoms (televisit), and f/u in 3 months to f/u labs (as liver enzyme abnormalities have been associated with medications, and f/u anemia).    A total of 15 minutes were spent face-to-face with the patient during this encounter and over half of that time dealt with counseling and coordination of care.   Rubie Maid, MD [facility]

## 2021-03-22 ENCOUNTER — Other Ambulatory Visit: Payer: Self-pay

## 2021-03-22 ENCOUNTER — Ambulatory Visit: Payer: BC Managed Care – PPO | Admitting: Obstetrics and Gynecology

## 2021-03-22 VITALS — BP 142/86 | HR 91 | Resp 16 | Ht 60.0 in | Wt 166.1 lb

## 2021-03-22 DIAGNOSIS — N800 Endometriosis of uterus: Secondary | ICD-10-CM

## 2021-03-22 DIAGNOSIS — D500 Iron deficiency anemia secondary to blood loss (chronic): Secondary | ICD-10-CM

## 2021-03-22 DIAGNOSIS — N8003 Adenomyosis of the uterus: Secondary | ICD-10-CM

## 2021-03-22 DIAGNOSIS — N920 Excessive and frequent menstruation with regular cycle: Secondary | ICD-10-CM

## 2021-03-22 DIAGNOSIS — J301 Allergic rhinitis due to pollen: Secondary | ICD-10-CM | POA: Diagnosis not present

## 2021-03-22 MED ORDER — ORILISSA 150 MG PO TABS: 1.0000 | ORAL_TABLET | Freq: Every day | ORAL | 3 refills | Status: DC

## 2021-03-22 NOTE — Patient Instructions (Signed)
Elagolix tablets What is this medication? ELAGOLIX (el a GOE lix) is used to treat endometriosis in women. It reducespain from the condition and may help reduce painful sexual intercourse. This medicine may be used for other purposes; ask your health care provider orpharmacist if you have questions. COMMON BRAND NAME(S): Orilissa What should I tell my care team before I take this medication? They need to know if you have any of these conditions: depression liver disease mental illness osteoporosis, weak bones suicidal thoughts, plans, or attempt; a previous suicide attempt by you or a family member an unusual or allergic reaction to elagolix, other medicines, foods, dyes, or preservatives pregnant or trying to get pregnant breast-feeding How should I use this medication? Take this medicine by mouth with a full glass of water. Take it as directed on the prescription label at the same time every day. You can take it with or without food. If it upsets your stomach, take it with food. Keep taking itunless your health care provider tells you to stop. A special MedGuide will be given to you by the pharmacist with eachprescription and refill. Be sure to read this information carefully each time. Talk to your pediatrician regarding the use of this medicine in children. Thismedicine is not approved for use in children. Overdosage: If you think you have taken too much of this medicine contact apoison control center or emergency room at once. NOTE: This medicine is only for you. Do not share this medicine with others. What if I miss a dose? If you miss a dose, take it as soon as you can. If it is almost time for yournext dose, take only that dose. Do not take double or extra doses. What may interact with this medication? Do not take this medicine with any of the following medications: cyclosporine enasidenib gemfibrozil This medicine may also interact with the following medications: certain  antivirals for HIV or hepatitis citalopram digoxin female hormones, like estrogens or progestins and birth control pills, patches, rings, or injections methadone midazolam omeprazole rifampin rosuvastatin This list may not describe all possible interactions. Give your health care provider a list of all the medicines, herbs, non-prescription drugs, or dietary supplements you use. Also tell them if you smoke, drink alcohol, or use illegaldrugs. Some items may interact with your medicine. What should I watch for while using this medication? Visit your doctor or health care professional for regular checks on yourprogress. This medicine may cause weak bones (osteoporosis). Only use this product for the amount of time your health care professional tells you to. The longer you use this product the more likely you will be at risk for weak bones. Ask yourhealth care professional how you can keep strong bones. Patients and their families should watch out for new or worsening depression or thoughts of suicide. Also watch out for sudden changes in feelings such as feeling anxious, agitated, panicky, irritable, hostile, aggressive, impulsive, severely restless, overly excited and hyperactive, or not being able to sleep.If this happens, call your health care professional. You may have a change in bleeding pattern or irregular periods. Many femalesstop having periods while taking this drug. Do not become pregnant while taking this medicine. Women should inform their doctor if they wish to become pregnant or think they might be pregnant. There is a potential for serious side effects to an unborn child. Talk to your healthcare professional or pharmacist for more information. This medicine does not prevent pregnancy. Women must use effective birth control with this medicine.   Use a non-hormonal form of birth control while taking this medicine and for 28 days after stopping it. Talk to your healthcare professional about  how to prevent pregnancy. What side effects may I notice from receiving this medication? Side effects that you should report to your doctor or health care professionalas soon as possible: allergic reactions like skin rash, itching or hives, swelling of the face, lips, or tongue anxious depressed mood signs and symptoms of liver injury like dark yellow or brown urine; general ill feeling or flu-like symptoms; light-colored stools; loss of appetite; nausea; right upper belly pain; unusually weak or tired; yellowing of the eyes or skin suicidal thoughts or other mood changes Side effects that usually do not require medical attention (report these toyour doctor or health care professional if they continue or are bothersome): reduced or absent menstrual periods headache hot flashes or night sweats nausea joint pain trouble sleeping This list may not describe all possible side effects. Call your doctor for medical advice about side effects. You may report side effects to FDA at1-800-FDA-1088. Where should I keep my medication? Keep out of the reach of children and pets. Store at room temperature between 20 and 25 degrees C (68 and 77 degrees F).Get rid of any unused medicine after the expiration date. It is important to get rid of the medicine as soon as you no longer need it orit is expired. You can do this in two ways: Take the medicine to a medicine take-back program. Check with your pharmacy or law enforcement to find a location. If you cannot return the medicine, follow the directions in the MedGuide. NOTE: This sheet is a summary. It may not cover all possible information. If you have questions about this medicine, talk to your doctor, pharmacist, orhealth care provider.  2022 Elsevier/Gold Standard (2019-11-01 08:56:37)  

## 2021-03-23 ENCOUNTER — Encounter: Payer: Self-pay | Admitting: Obstetrics and Gynecology

## 2021-03-23 DIAGNOSIS — N920 Excessive and frequent menstruation with regular cycle: Secondary | ICD-10-CM | POA: Insufficient documentation

## 2021-03-23 DIAGNOSIS — N800 Endometriosis of uterus: Secondary | ICD-10-CM | POA: Insufficient documentation

## 2021-03-23 DIAGNOSIS — D500 Iron deficiency anemia secondary to blood loss (chronic): Secondary | ICD-10-CM | POA: Insufficient documentation

## 2021-03-23 DIAGNOSIS — N8003 Adenomyosis of the uterus: Secondary | ICD-10-CM | POA: Insufficient documentation

## 2021-03-25 DIAGNOSIS — J301 Allergic rhinitis due to pollen: Secondary | ICD-10-CM | POA: Diagnosis not present

## 2021-03-28 DIAGNOSIS — J309 Allergic rhinitis, unspecified: Secondary | ICD-10-CM | POA: Diagnosis not present

## 2021-03-29 ENCOUNTER — Encounter: Payer: Self-pay | Admitting: Obstetrics and Gynecology

## 2021-04-01 DIAGNOSIS — J301 Allergic rhinitis due to pollen: Secondary | ICD-10-CM | POA: Diagnosis not present

## 2021-04-03 ENCOUNTER — Telehealth: Payer: Self-pay | Admitting: Obstetrics and Gynecology

## 2021-04-03 NOTE — Telephone Encounter (Signed)
Please call Donna Perry with [employer] at [phone] Ext [phone].  She indicated that she has a few questions about the paperwork that was sent in

## 2021-04-03 NOTE — Telephone Encounter (Signed)
Spoke to an Mining engineer at [employer] and was inform to complete another form for the patient. Completed the form and placed form on [facility] desk to be signed.

## 2021-04-03 NOTE — Telephone Encounter (Signed)
Seth Bake with [employer], asked for a call back from [facility]. at [phone] ext [phone].

## 2021-04-04 DIAGNOSIS — J301 Allergic rhinitis due to pollen: Secondary | ICD-10-CM | POA: Diagnosis not present

## 2021-04-11 DIAGNOSIS — J301 Allergic rhinitis due to pollen: Secondary | ICD-10-CM | POA: Diagnosis not present

## 2021-04-11 NOTE — Telephone Encounter (Signed)
Patient has been approved for the medication.

## 2021-04-12 ENCOUNTER — Other Ambulatory Visit: Payer: Self-pay

## 2021-04-15 DIAGNOSIS — J301 Allergic rhinitis due to pollen: Secondary | ICD-10-CM | POA: Diagnosis not present

## 2021-04-22 DIAGNOSIS — J301 Allergic rhinitis due to pollen: Secondary | ICD-10-CM | POA: Diagnosis not present

## 2021-04-30 ENCOUNTER — Telehealth: Payer: BC Managed Care – PPO | Admitting: Obstetrics and Gynecology

## 2021-04-30 ENCOUNTER — Encounter: Payer: Self-pay | Admitting: Obstetrics and Gynecology

## 2021-04-30 VITALS — Ht 60.0 in | Wt 167.0 lb

## 2021-04-30 DIAGNOSIS — N920 Excessive and frequent menstruation with regular cycle: Secondary | ICD-10-CM

## 2021-04-30 DIAGNOSIS — N8003 Adenomyosis of the uterus: Secondary | ICD-10-CM

## 2021-04-30 DIAGNOSIS — D500 Iron deficiency anemia secondary to blood loss (chronic): Secondary | ICD-10-CM

## 2021-04-30 DIAGNOSIS — N800 Endometriosis of uterus: Secondary | ICD-10-CM | POA: Diagnosis not present

## 2021-04-30 NOTE — Progress Notes (Signed)
Virtual Visit via Video Note  I connected with Donna Perry on 04/30/21 at 10:30 AM EDT by a video enabled telemedicine application and verified that I am speaking with the correct person using two identifiers.  Location: Patient: Home Provider: Office   I discussed the limitations of evaluation and management by telemedicine and the availability of in person appointments. The patient expressed understanding and agreed to proceed.  History of Present Illness: Donna Perry is a 45 y.o. G6P1001 female who presents for 1 month f/u after initiation of Orilissa for management of adenomyosis and menorrhagia. Notes that her first cycle on the medication lasted 7 days (previously lasted 5), with heavy bleeding noted on Day #2 (bled through her clothing).  She additionally had spotting for 2-3 days after the end of the cycle. Pain had improved, only using Ibuprofen and Tylenol.   Reports that her PCP recently drew labs and she was still noted to be anemic. Is trying to increase iron intake in her diet.    Observations/Objective:  Height 5' (1.524 m), weight 167 lb (75.8 kg) - patient reported, last menstrual period 04/17/2021. Gen App: no acute distress, appears well. Skin: normal, no lesions. Neurologic: grossly intact  Assessment and Plan:  1. Adenomyosis - Will continue Orilissa trial for 3 months.  If no improvement, can consider increasing dosing to 200 mg, or alternatively patient willing to consider surgical intervention with endometrial ablation.   2. Menorrhagia with regular cycle - Will continue Orilissa trial for 3 months.  If no improvement, can consider increasing dosing to 200 mg, or alternatively patient willing to consider surgical intervention with endometrial ablation.   3. Iron deficiency anemia due to chronic blood loss - Patient working to increase iron intake in diet. Would also recommend daily PO iron supplement OTC.    Follow Up Instructions:  F/u in 2 months to reassess  symptoms. Will need hepatic function testing at that time as Orilissa side effect includes liver function disturbances.    I discussed the assessment and treatment plan with the patient. The patient was provided an opportunity to ask questions and all were answered. The patient agreed with the plan and demonstrated an understanding of the instructions.   The patient was advised to call back or seek an in-person evaluation if the symptoms worsen or if the condition fails to improve as anticipated.  I provided 9 minutes of face-to-face time during this encounter.   Rubie Maid, MD [facility]

## 2021-05-01 ENCOUNTER — Encounter: Payer: BC Managed Care – PPO | Admitting: Obstetrics and Gynecology

## 2021-05-16 ENCOUNTER — Encounter: Payer: BC Managed Care – PPO | Admitting: Obstetrics and Gynecology

## 2021-07-10 ENCOUNTER — Other Ambulatory Visit: Payer: Self-pay

## 2021-07-10 ENCOUNTER — Ambulatory Visit: Payer: BC Managed Care – PPO | Admitting: Obstetrics and Gynecology

## 2021-07-10 ENCOUNTER — Encounter: Payer: Self-pay | Admitting: Obstetrics and Gynecology

## 2021-07-10 VITALS — BP 144/91 | HR 88 | Ht 60.0 in | Wt 165.1 lb

## 2021-07-10 DIAGNOSIS — N920 Excessive and frequent menstruation with regular cycle: Secondary | ICD-10-CM

## 2021-07-10 DIAGNOSIS — Z79899 Other long term (current) drug therapy: Secondary | ICD-10-CM

## 2021-07-10 DIAGNOSIS — N8003 Adenomyosis of the uterus: Secondary | ICD-10-CM | POA: Diagnosis not present

## 2021-07-10 DIAGNOSIS — D500 Iron deficiency anemia secondary to blood loss (chronic): Secondary | ICD-10-CM | POA: Diagnosis not present

## 2021-07-10 DIAGNOSIS — N951 Menopausal and female climacteric states: Secondary | ICD-10-CM

## 2021-07-10 NOTE — Progress Notes (Signed)
    GYNECOLOGY PROGRESS NOTE  Subjective:    Patient ID: Donna Perry, female    DOB: 12-11-1975, 45 y.o.   MRN: [id number]  HPI  Patient is a 45 y.o. G94P1001 female who presents for 3 month f/u of abnormal uterine bleeding (concerns for adenomyosis).  Reports that her bleeding has finally stopped and is very happy with use of the [place].  Does note issues with her blood pressure so discussed with PCP who is changing her BP medicine to Amlodipine. Also noting some hot flushes, not relieved with [facility] (also noting it affected her allergies).   The following portions of the patient's history were reviewed and updated as appropriate: allergies, current medications, past family history, past medical history, past social history, past surgical history, and problem list.  Review of Systems Pertinent items are noted in HPI.   Objective:   Blood pressure (!) 144/91, pulse 88, height 5' (1.524 m), weight 165 lb 1.6 oz (74.9 kg), last menstrual period 04/28/2021.  Body mass index is 32.24 kg/m. General appearance: alert and no distress Remainder of exam deferred.   Assessment:   1. Medication management   2. Adenomyosis   3. Menorrhagia with regular cycle   4. Iron deficiency anemia due to chronic blood loss   5. Perimenopausal vasomotor symptoms      Plan:   Continue use of [facility] as it is helping manage her symptoms.  Will check hepatic panel today due to possible abnormalities caused by [employer].  Perimenopausal vasomotor symptoms, discussed other natural supplements. If still no relief, can consider prescription for Clonidine as it may help with BPs as well as hot flushes.   RTC in 6 months.    Rubie Maid, MD [facility]

## 2021-07-10 NOTE — Patient Instructions (Signed)
Perimenopause ?Perimenopause is the normal time of a woman's life when the levels of estrogen, the female hormone produced by the ovaries, begin to decrease. This leads to changes in menstrual periods before they stop completely (menopause). Perimenopause can begin 2-8 years before menopause. During perimenopause, the ovaries may or may not produce an egg and a woman can still become pregnant. ?What are the causes? ?This condition is caused by a natural change in hormone levels that happens as you get older. ?What increases the risk? ?This condition is more likely to start at an earlier age if you have certain medical conditions or have undergone treatments, including: ?A tumor of the pituitary gland in the brain. ?A disease that affects the ovaries and hormone production. ?Certain cancer treatments, such as chemotherapy or hormone therapy, or radiation therapy on the pelvis. ?Heavy smoking and excessive alcohol use. ?Family history of early menopause. ?What are the signs or symptoms? ?Perimenopausal changes affect each woman differently. Symptoms of this condition may include: ?Hot flashes. ?Irregular menstrual periods. ?Night sweats. ?Changes in feelings about sex. This could be a decrease in sex drive or an increased discomfort around your sexuality. ?Vaginal dryness. ?Headaches. ?Mood swings. ?Depression. ?Problems sleeping (insomnia). ?Memory problems or trouble concentrating. ?Irritability. ?Tiredness. ?Weight gain. ?Anxiety. ?Trouble getting pregnant. ?How is this diagnosed? ?This condition is diagnosed based on your medical history, a physical exam, your age, your menstrual history, and your symptoms. Hormone tests may also be done. ?How is this treated? ?In some cases, no treatment is needed. You and your health care provider should make a decision together about whether treatment is necessary. Treatment will be based on your individual condition and preferences. Various treatments are available, such  as: ?Menopausal hormone therapy (MHT). ?Medicines to treat specific symptoms. ?Acupuncture. ?Vitamin or herbal supplements. ?Before starting treatment, make sure to let your health care provider know if you have a personal or family history of: ?Heart disease. ?Breast cancer. ?Blood clots. ?Diabetes. ?Osteoporosis. ?Follow these instructions at home: ?Medicines ?Take over-the-counter and prescription medicines only as told by your health care provider. ?Take vitamin supplements only as told by your health care provider. ?Talk with your health care provider before starting any herbal supplements. ?Lifestyle ? ?Do not use any products that contain nicotine or tobacco, such as cigarettes, e-cigarettes, and chewing tobacco. If you need help quitting, ask your health care provider. ?Get at least 30 minutes of physical activity on 5 or more days each week. ?Eat a balanced diet that includes fresh fruits and vegetables, whole grains, soybeans, eggs, lean meat, and low-fat dairy. ?Avoid alcoholic and caffeinated beverages, as well as spicy foods. This may help prevent hot flashes. ?Get 7-8 hours of sleep each night. ?Dress in layers that can be removed to help you manage hot flashes. ?Find ways to manage stress, such as deep breathing, meditation, or journaling. ?General instructions ? ?Keep track of your menstrual periods, including: ?When they occur. ?How heavy they are and how long they last. ?How much time passes between periods. ?Keep track of your symptoms, noting when they start, how often you have them, and how long they last. ?Use vaginal lubricants or moisturizers to help with vaginal dryness and improve comfort during sex. ?You can still become pregnant if you are having irregular periods. Make sure you use contraception during perimenopause if you do not want to get pregnant. ?Keep all follow-up visits. This is important. This includes any group therapy or counseling. ?Contact a health care provider if: ?You  have   heavy vaginal bleeding or pass blood clots. ?Your period lasts more than 2 days longer than normal. ?Your periods are recurring sooner than 21 days. ?You bleed after having sex. ?You have pain during sex. ?Get help right away if you have: ?Chest pain, trouble breathing, or trouble talking. ?Severe depression. ?Pain when you urinate. ?Severe headaches. ?Vision problems. ?Summary ?Perimenopause is the time when a woman's body begins to move into menopause. This may happen naturally or as a result of other health problems or medical treatments. ?Perimenopause can begin 2-8 years before menopause, and it can last for several years. ?Perimenopausal symptoms can be managed through medicines, lifestyle changes, and complementary therapies such as acupuncture. ?This information is not intended to replace advice given to you by your health care provider. Make sure you discuss any questions you have with your health care provider. ?Document Revised: 01/05/2020 Document Reviewed: 01/05/2020 ?Elsevier Patient Education ? 2022 Elsevier Inc. ? ?

## 2021-07-11 LAB — HEPATIC FUNCTION PANEL
ALT: 19 IU/L (ref 0–32)
AST: 24 IU/L (ref 0–40)
Albumin: 4.7 g/dL (ref 3.8–4.8)
Alkaline Phosphatase: 86 IU/L (ref 44–121)
Bilirubin Total: 0.3 mg/dL (ref 0.0–1.2)
Bilirubin, Direct: 0.1 mg/dL (ref 0.00–0.40)
Total Protein: 7.1 g/dL (ref 6.0–8.5)

## 2021-10-09 IMAGING — US US TRANSVAGINAL NON-OB
1 series · 13 of 25 positions shown · non-contrast
Comparison: Ultrasound from 02/03/2017 and CT from 03/09/2019.

CLINICAL DATA: Initial evaluation for dysfunctional uterine
bleeding, pelvic mass.



[Series 1: us pelvis (transabdominal only) · 13 of 82 slices shown]
[im 1/82]
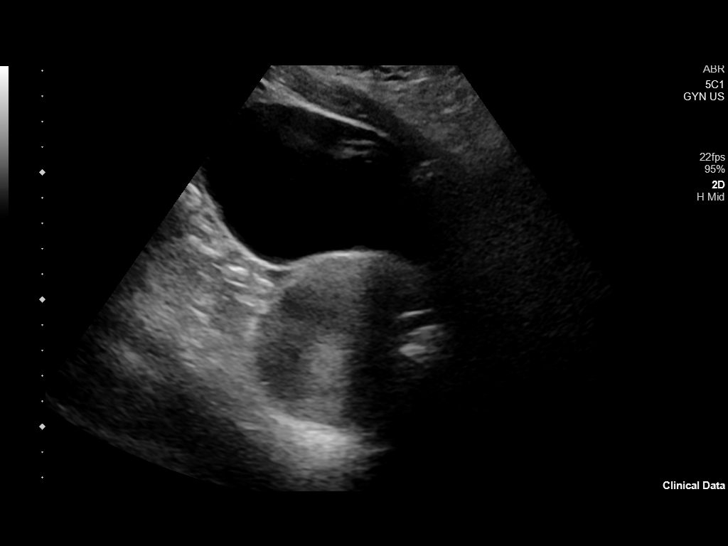
[im 7/82]
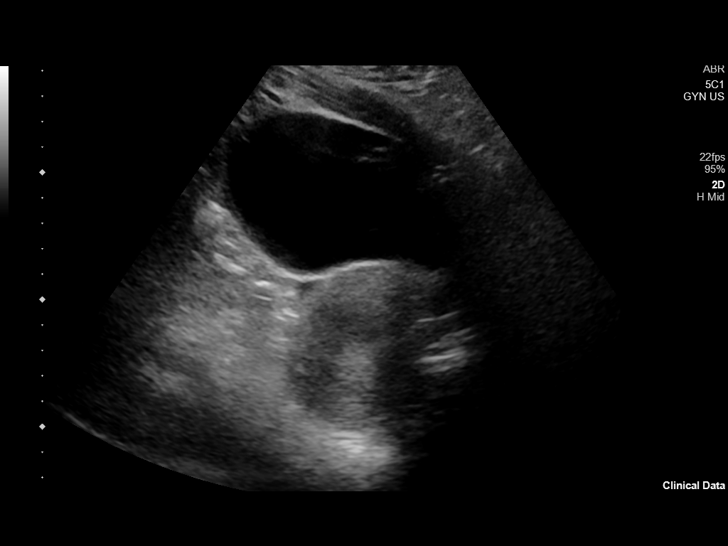
[im 14/82]
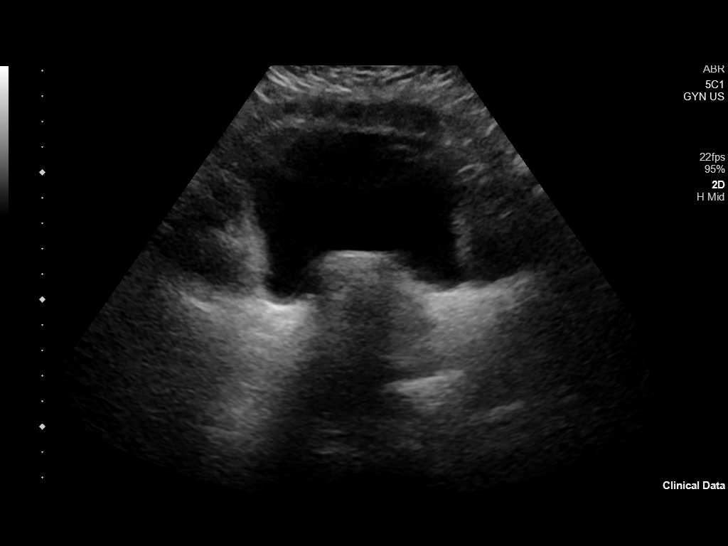
[im 21/82]
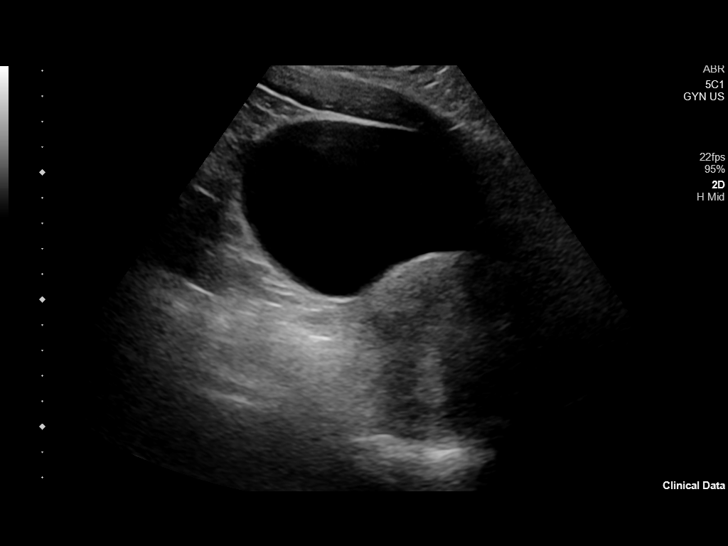
[im 28/82]
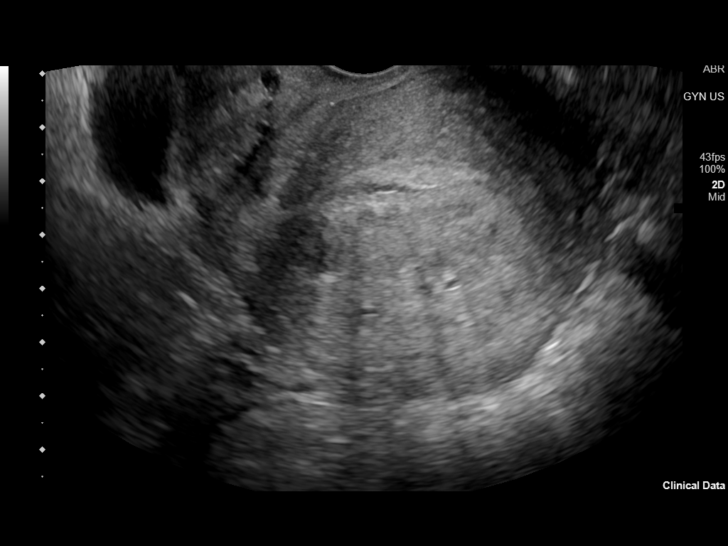
[im 34/82]
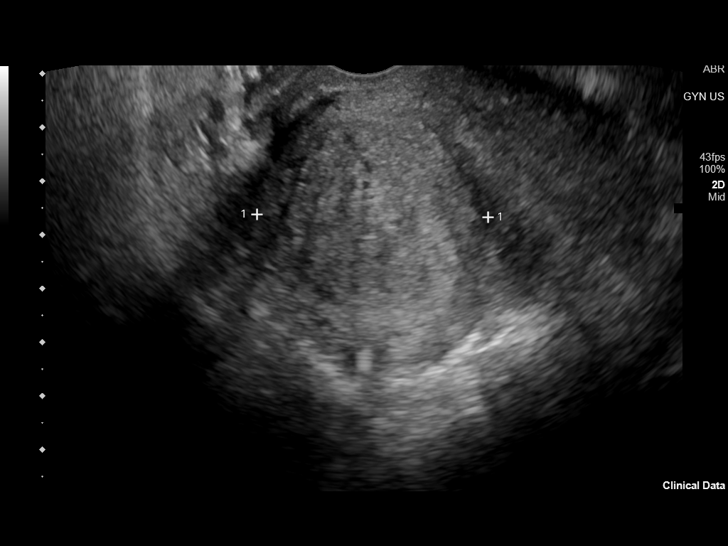
[im 41/82]
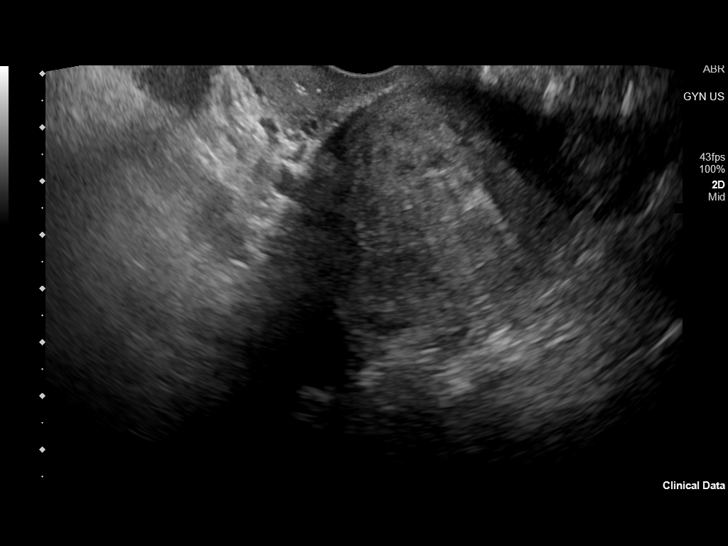
[im 48/82]
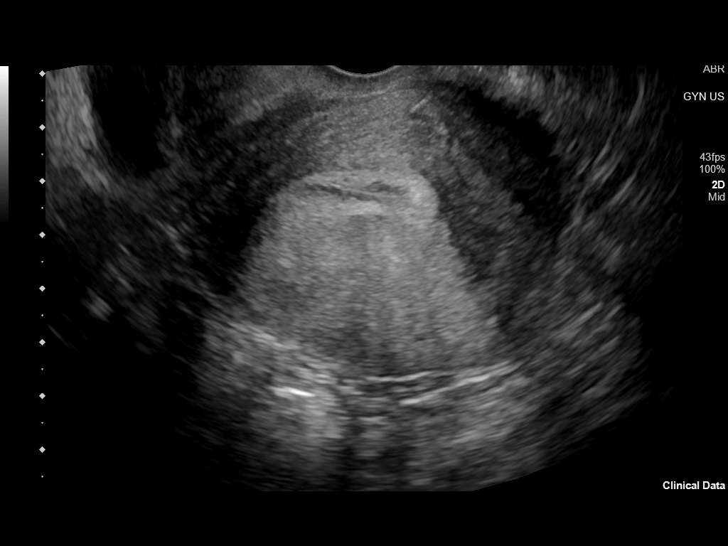
[im 55/82]
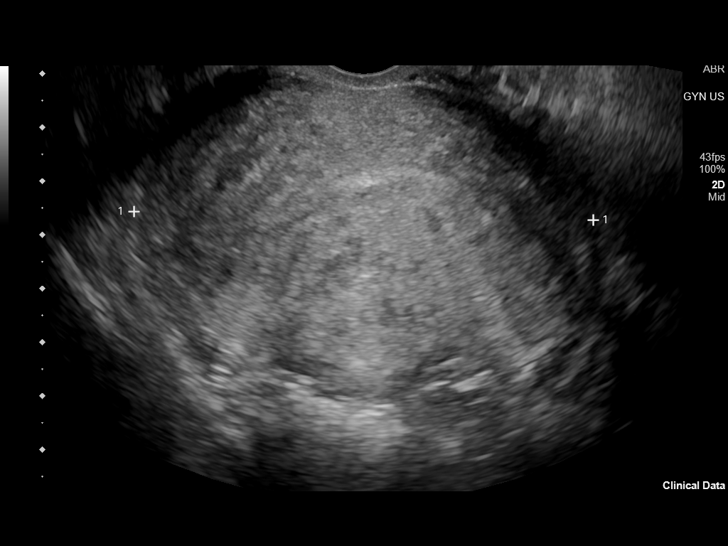
[im 61/82]
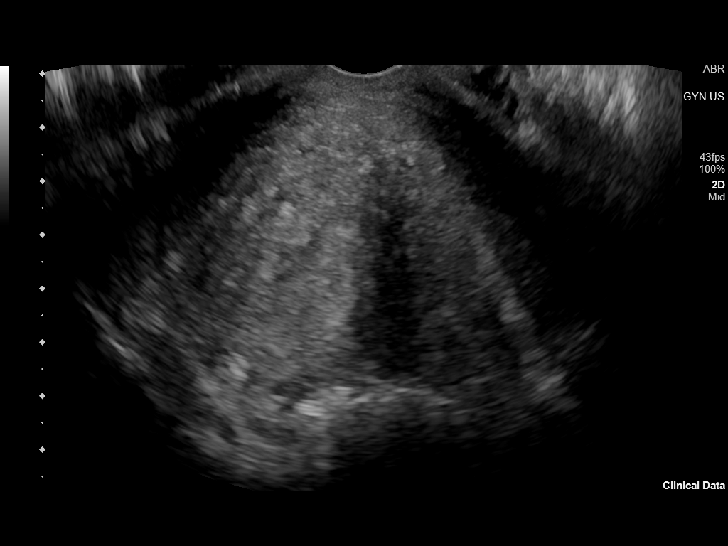
[im 68/82]
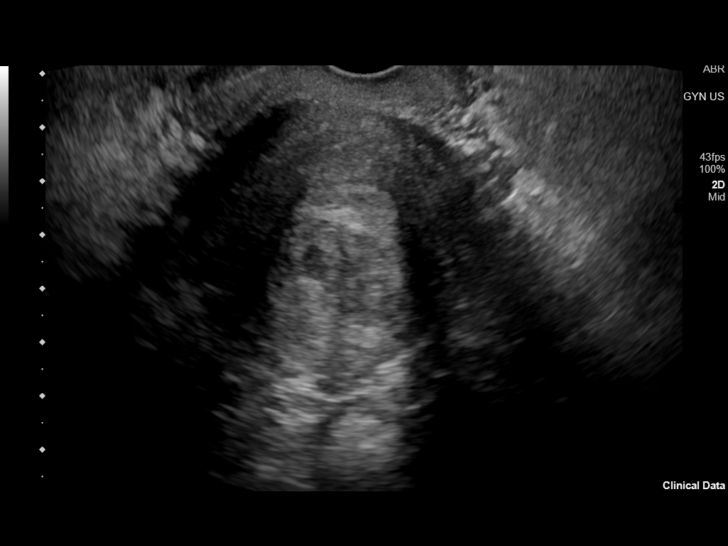
[im 75/82]
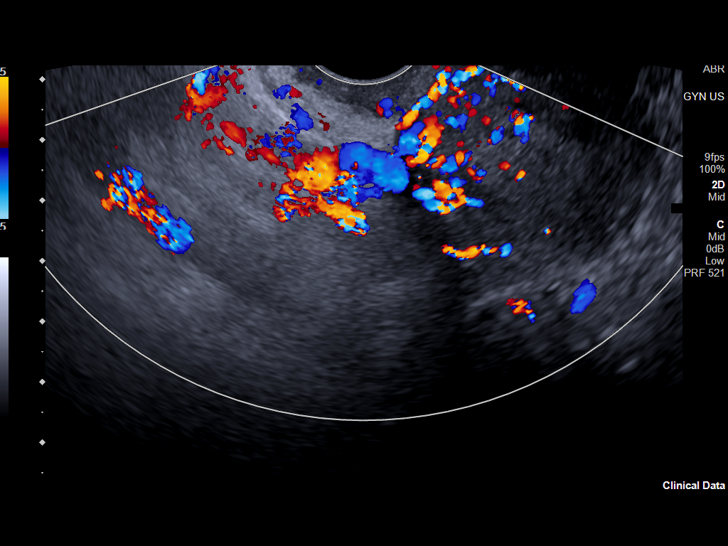
[im 82/82]
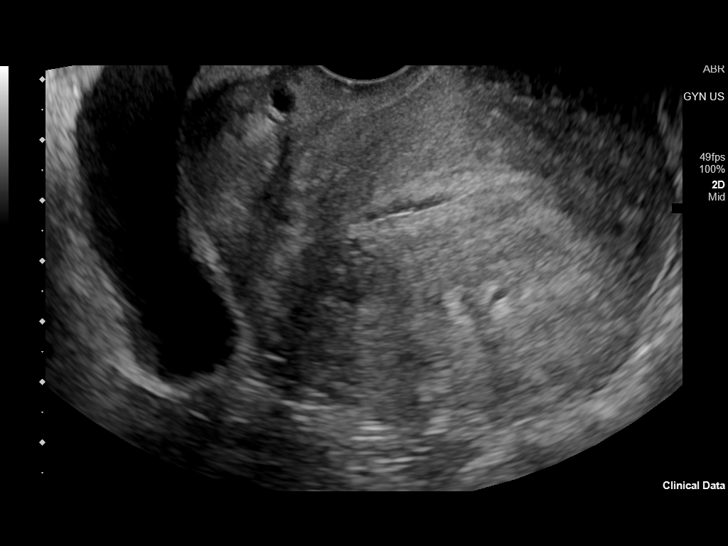

[13 of 25 positions shown; findings below may reference images not displayed]

FINDINGS: Uterus

Measurements: 8.3 x 6.3 x 8.6 cm = volume: 233.8 mL. Uterus is
retroflexed. Heterogeneous echotexture seen diffusely throughout the
uterine myometrium with multiple scattered echogenic striations,
suggesting adenomyosis. No definite discrete fibroid.

Endometrium

Thickness: 7 mm. No focal abnormality. Small volume minimally
complex fluid noted within the endometrial cavity.

Right ovary

Not visualized.  No adnexal mass.

Left ovary

Not visualized.  No adnexal mass.

Other findings

Small volume free fluid noted within the pelvis.
IMPRESSION: 1. Endometrial stripe measures 7 mm in thickness with small volume
minimally complex fluid within the endometrial cavity. If bleeding
remains unresponsive to hormonal or medical therapy, sonohysterogram
should be considered for focal lesion work-up. (Ref: Radiological
Reasoning: Algorithmic Workup of Abnormal Vaginal Bleeding with
Endovaginal Sonography and Sonohysterography. AJR [id number]; [id number]).
2. Probable uterine adenomyosis.  No definite discrete fibroid.
3. Nonvisualization of either ovary.  No adnexal mass.
4. Small volume free fluid within the pelvis, nonspecific, but most
commonly physiologic.

## 2021-10-09 IMAGING — US US PELVIS COMPLETE
1 series · 13 of 25 positions shown · non-contrast
Comparison: Ultrasound from 02/03/2017 and CT from 03/09/2019.

CLINICAL DATA: Initial evaluation for dysfunctional uterine
bleeding, pelvic mass.



[Series 1: us pelvis (transabdominal only) · 13 of 82 slices shown]
[im 1/82]
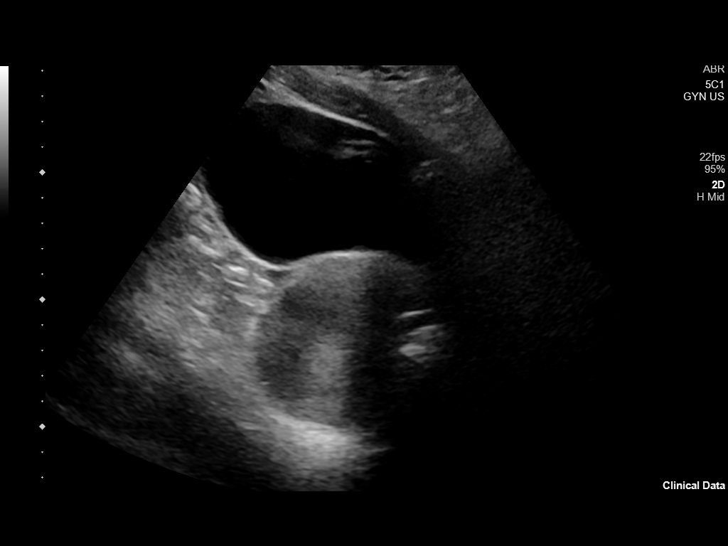
[im 7/82]
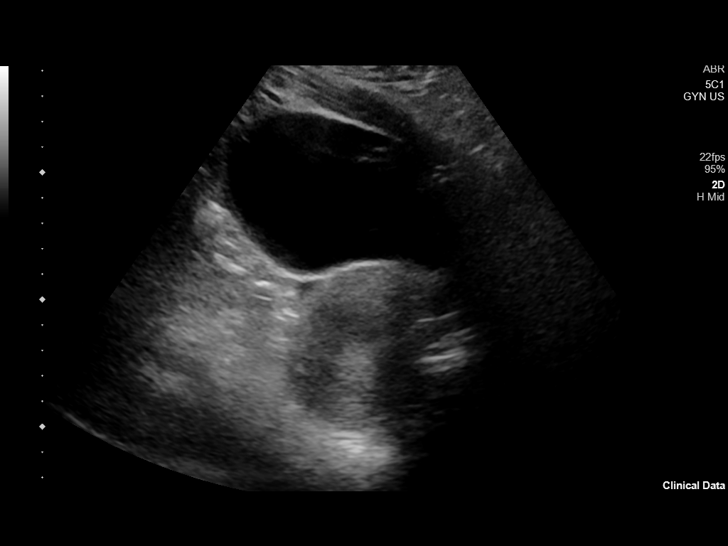
[im 14/82]
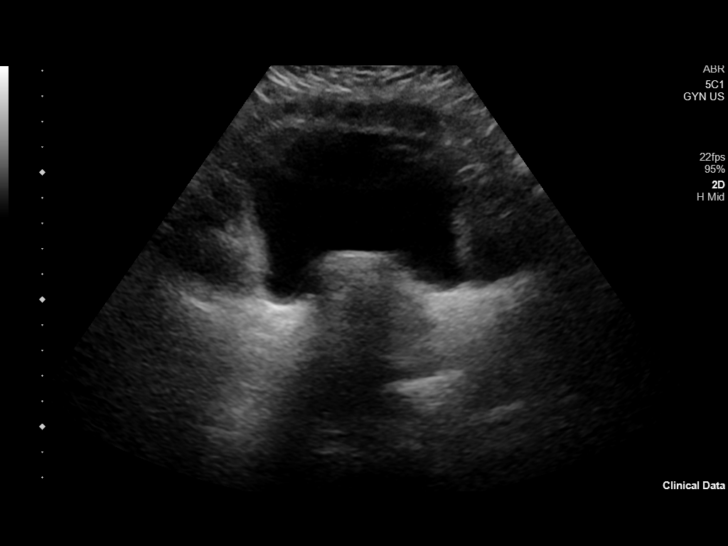
[im 21/82]
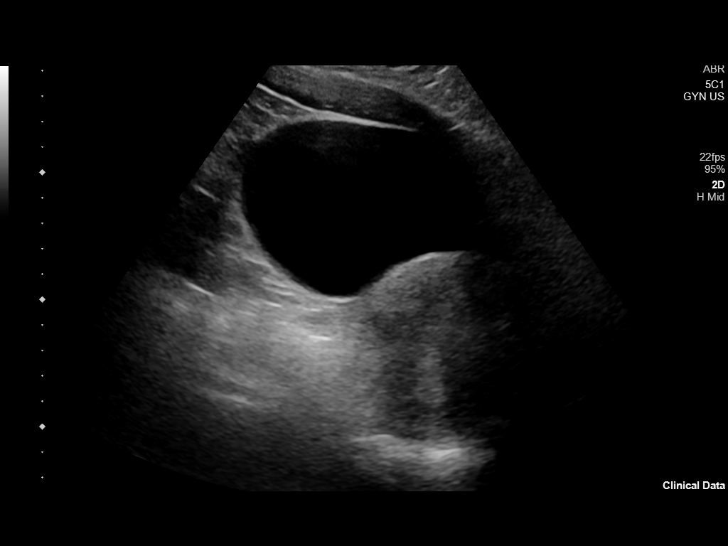
[im 28/82]
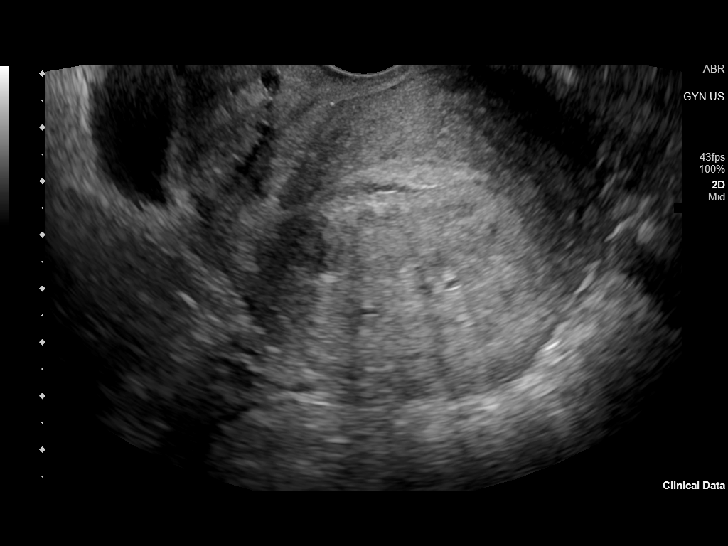
[im 34/82]
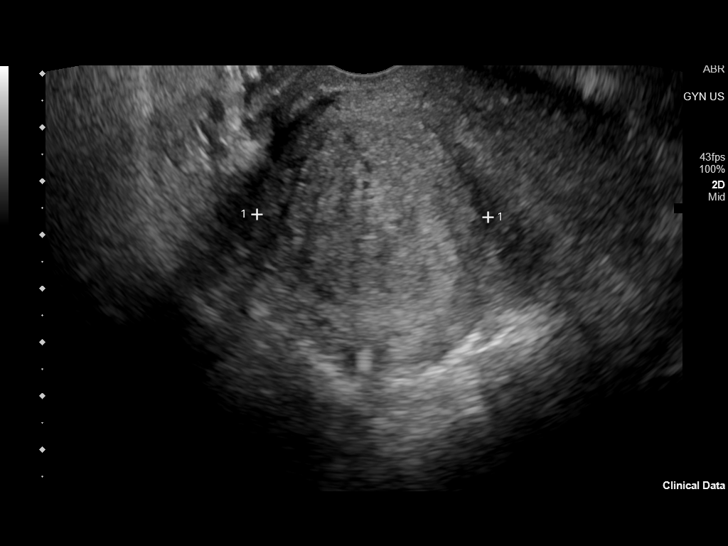
[im 41/82]
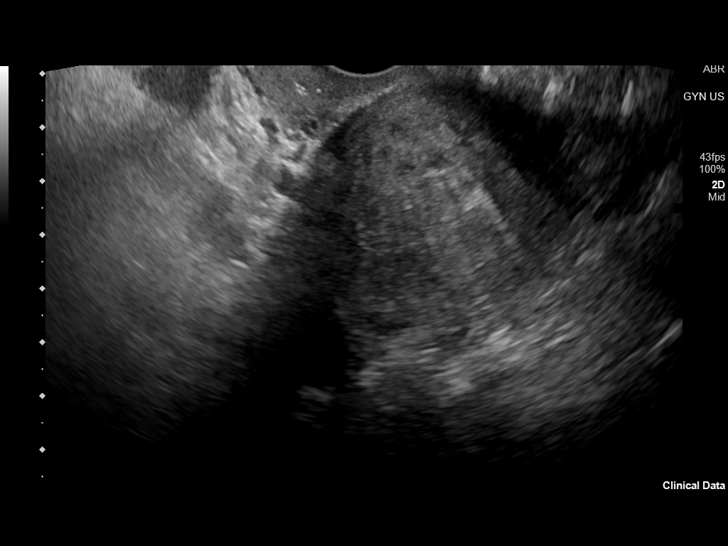
[im 48/82]
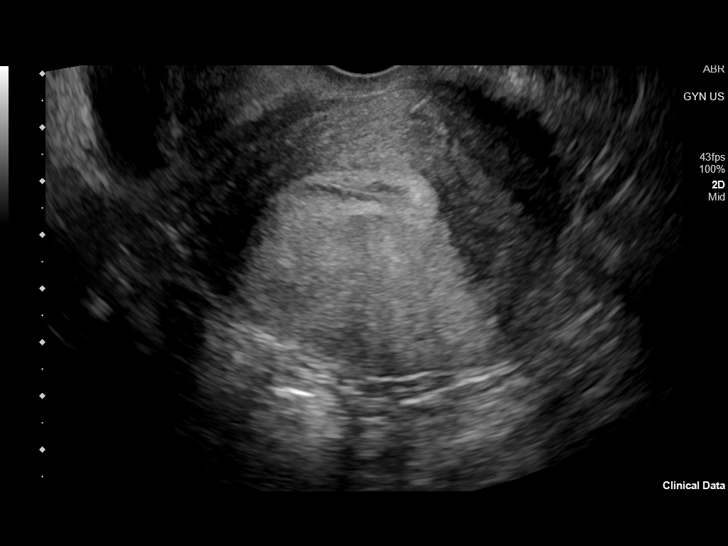
[im 55/82]
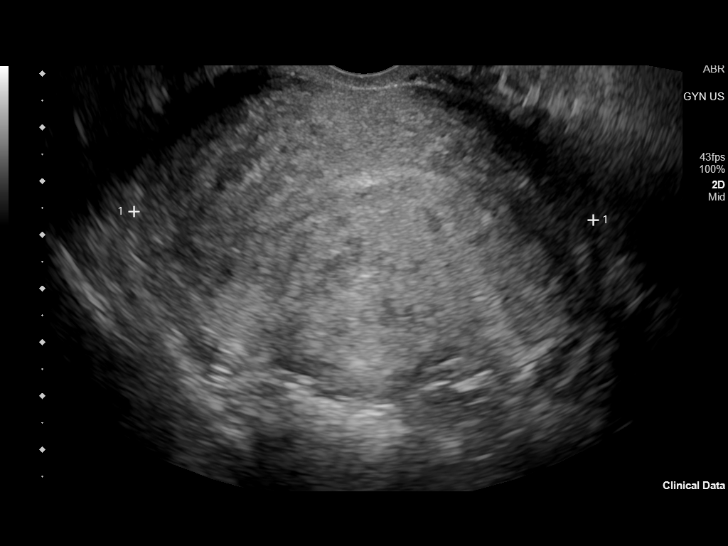
[im 61/82]
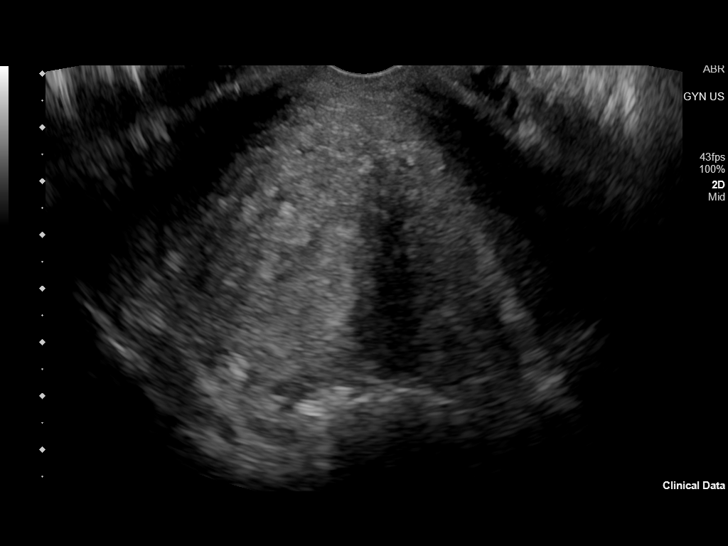
[im 68/82]
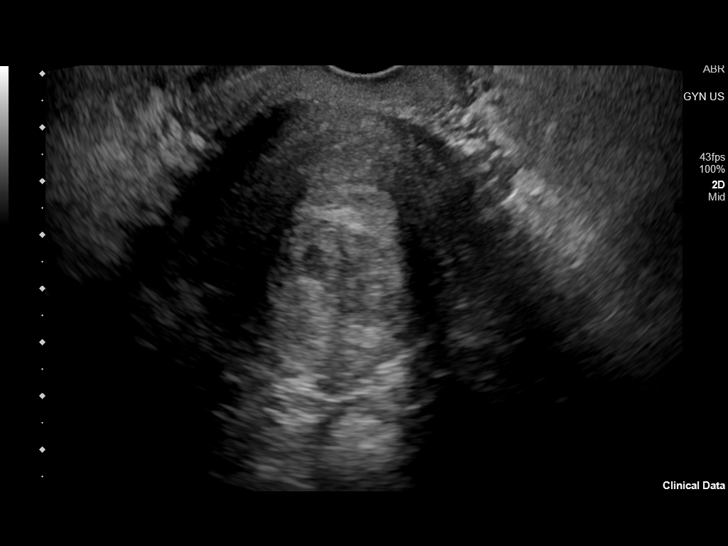
[im 75/82]
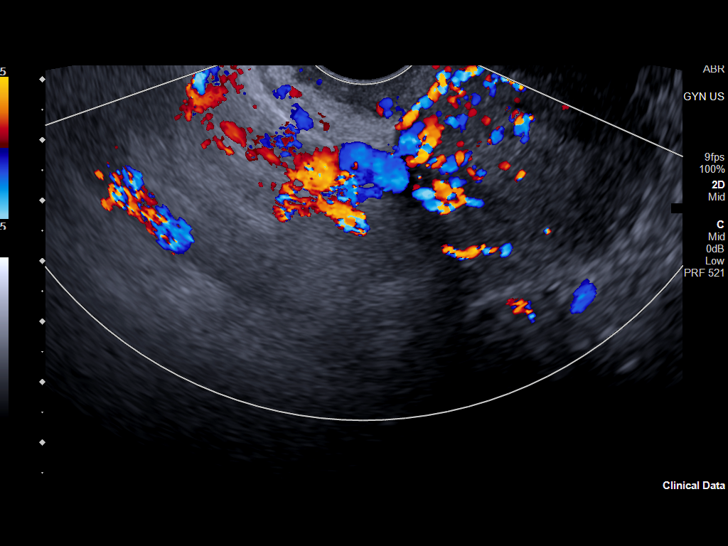
[im 82/82]
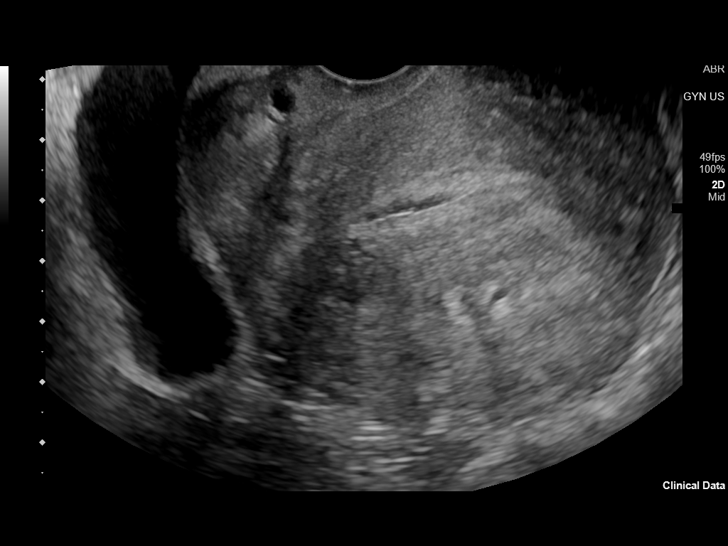

[13 of 25 positions shown; findings below may reference images not displayed]

FINDINGS: Uterus

Measurements: 8.3 x 6.3 x 8.6 cm = volume: 233.8 mL. Uterus is
retroflexed. Heterogeneous echotexture seen diffusely throughout the
uterine myometrium with multiple scattered echogenic striations,
suggesting adenomyosis. No definite discrete fibroid.

Endometrium

Thickness: 7 mm. No focal abnormality. Small volume minimally
complex fluid noted within the endometrial cavity.

Right ovary

Not visualized.  No adnexal mass.

Left ovary

Not visualized.  No adnexal mass.

Other findings

Small volume free fluid noted within the pelvis.
IMPRESSION: 1. Endometrial stripe measures 7 mm in thickness with small volume
minimally complex fluid within the endometrial cavity. If bleeding
remains unresponsive to hormonal or medical therapy, sonohysterogram
should be considered for focal lesion work-up. (Ref: Radiological
Reasoning: Algorithmic Workup of Abnormal Vaginal Bleeding with
Endovaginal Sonography and Sonohysterography. AJR [id number]; 191:[id number]).
2. Probable uterine adenomyosis.  No definite discrete fibroid.
3. Nonvisualization of either ovary.  No adnexal mass.
4. Small volume free fluid within the pelvis, nonspecific, but most
commonly physiologic.

## 2021-11-22 ENCOUNTER — Ambulatory Visit: Payer: BC Managed Care – PPO | Admitting: Urology

## 2021-11-22 ENCOUNTER — Encounter: Payer: Self-pay | Admitting: Urology

## 2021-11-22 VITALS — BP 150/93 | HR 84 | Ht 60.0 in | Wt 158.0 lb

## 2021-11-22 DIAGNOSIS — N281 Cyst of kidney, acquired: Secondary | ICD-10-CM

## 2021-11-22 DIAGNOSIS — N3281 Overactive bladder: Secondary | ICD-10-CM | POA: Diagnosis not present

## 2021-11-22 NOTE — Progress Notes (Signed)
? ?11/22/2021 ?12:34 PM  ? ?Donna Perry ?August 28, 1975 ?[id number] ? ?Referring provider: Elijah Birk, PA ?[address] ?[address] ?[place],  [place] [address] ? ?Chief Complaint  ?Patient presents with  ? Follow-up  ? ? ?HPI: ?46 y.o. female presents for annual follow-up. ? ?Doing well since last visit ?No bothersome LUTS ?Remains on Solifenacin ?Denies dysuria, gross hematuria ?Denies flank, abdominal or pelvic pain ? ? ?PMH: ?Past Medical History:  ?Diagnosis Date  ? Anemia   ? Hypertension   ? Seasonal allergies   ? ? ?Surgical History: ?Past Surgical History:  ?Procedure Laterality Date  ? ESSURE TUBAL LIGATION    ? HYSTEROSCOPY WITH D & C N/A 02/25/2019  ? Procedure: DILATATION AND CURETTAGE /HYSTEROSCOPY;  Surgeon: Rubie Maid, MD;  Location: [facility];  Service: Gynecology;  Laterality: N/A;  ? LAPAROSCOPIC BILATERAL SALPINGECTOMY Bilateral 02/25/2019  ? Procedure: LAPAROSCOPIC BILATERAL SALPINGECTOMY, REMOVAL OF ESSURE COIL,CYSTOSCOPY;  Surgeon: Rubie Maid, MD;  Location: [facility];  Service: Gynecology;  Laterality: Bilateral;  ? LEEP    ? LEG SURGERY Left   ? ? ?Home Medications:  ?Allergies as of 11/22/2021   ? ?   Reactions  ? Elemental Sulfur   ? Sulfamethoxazole-trimethoprim Hives  ? ?  ? ?  ?Medication List  ?  ? ?  ? Accurate as of November 22, 2021 12:34 PM. If you have any questions, ask your nurse or doctor.  ?  ?  ? ?  ? ?STOP taking these medications   ? ?docusate sodium 100 MG capsule ?Commonly known as: COLACE ?Stopped by: Abbie Sons, MD ?  ? ?  ? ?TAKE these medications   ? ?amlodipine-atorvastatin 10-10 MG tablet ?Commonly known as: CADUET ?Take 1 tablet by mouth daily. ?  ?APPLE CIDER VINEGAR PO ?Take by mouth. ?  ?B-12 PO ?Take by mouth. ?  ?ELDERBERRY PO ?Take 5 mLs by mouth daily. ?  ?EPINEPHrine 0.3 mg/0.3 mL Soaj injection ?Commonly known as: EPI-PEN ?Inject 0.3 mg into the muscle as needed for anaphylaxis. ?  ?ferrous sulfate 325 (65 FE) MG EC tablet ?Take 325 mg by mouth 2 (two) times a day. ?   ?fexofenadine 180 MG tablet ?Commonly known as: ALLEGRA ?Take 180 mg by mouth. ?  ?fluticasone 50 MCG/ACT nasal spray ?Commonly known as: FLONASE ?Place 1 spray into both nostrils daily as needed for allergies. ?  ?hydrochlorothiazide 25 MG tablet ?Commonly known as: HYDRODIURIL ?Take 25 mg by mouth daily. ?  ?ibuprofen 800 MG tablet ?Commonly known as: ADVIL ?Take 1 tablet (800 mg total) by mouth every 8 (eight) hours as needed. ?  ?levocetirizine 5 MG tablet ?Commonly known as: XYZAL ?Take 5 mg by mouth every evening. ?  ?loratadine 10 MG tablet ?Commonly known as: CLARITIN ?Take 10 mg by mouth daily. ?  ?nystatin powder ?Commonly known as: MYCOSTATIN/NYSTOP ?Apply 1 Bottle topically 2 (two) times daily as needed (IRRITATION). ?  ?Orilissa 150 MG Tabs ?Generic drug: Elagolix Sodium ?Take 1 tablet by mouth daily. ?  ?Potassium 99 MG Tabs ?Take 99 mg by mouth daily as needed (POTASSIUM LEVELS). ?  ?solifenacin 5 MG tablet ?Commonly known as: VESICARE ?Take 1 tablet (5 mg total) by mouth daily. ?What changed: additional instructions ?  ? ?  ? ? ?Allergies:  ?Allergies  ?Allergen Reactions  ? Elemental Sulfur   ? Sulfamethoxazole-Trimethoprim Hives  ? ? ?Family History: ?Family History  ?Problem Relation Age of Onset  ? Cervical cancer Mother   ? Hypertension Mother   ?  Lung cancer Father   ? Breast cancer Neg Hx   ? Heart failure Neg Hx   ? Seizures Neg Hx   ? ? ?Social History:  reports that she has never smoked. She has never used smokeless tobacco. She reports current alcohol use. She reports that she does not use drugs. ? ? ?Physical Exam: ?BP (!) 150/93   Pulse 84   Ht 5' (1.524 m)   Wt 158 lb (71.7 kg)   BMI 30.86 kg/m?   ?Constitutional:  Alert and oriented, No acute distress. ?HEENT: [place] AT, moist mucus membranes.  Trachea midline, no masses. ?Cardiovascular: No clubbing, cyanosis, or edema. ?Respiratory: Normal respiratory effort, no increased work of breathing. ? ? ?Assessment & Plan:   ? ?1.  Left  renal cyst ?On a single imaging study radiology felt this was a Bosniak 14F cyst and on follow-up studies was felt to be simple ?Will schedule follow-up renal ultrasound in 1 year ? ? ?2.  Overactive bladder ?Stable ?Continue Solifenacin ?Annual follow-up ? ? ?Abbie Sons, MD ? ?[facility] ?[address], [address] ?[place], [place] [address] ?[phone] ? ?

## 2022-01-07 NOTE — Progress Notes (Unsigned)
    GYNECOLOGY PROGRESS NOTE  Subjective:    Patient ID: Wm Fruchter, female    DOB: 1975-08-09, 46 y.o.   MRN: [id number]  HPI  Patient is a 46 y.o. G27P1001 female who presents for 6 month follow up of abnormal uterine bleeding (concerns for adenomyosis). She has been on Orlissa for 9 months.  The following portions of the patient's history were reviewed and updated as appropriate: allergies, current medications, past family history, past medical history, past social history, past surgical history, and problem list.  Review of Systems Pertinent items are noted in HPI.   Objective:   There were no vitals taken for this visit. There is no height or weight on file to calculate BMI. General appearance: alert, cooperative, and no distress Abdomen: {abdominal exam:16834} Pelvic: {pelvic exam:16852::"cervix normal in appearance","external genitalia normal","no adnexal masses or tenderness","no cervical motion tenderness","rectovaginal septum normal","uterus normal size, shape, and consistency","vagina normal without discharge"} Extremities: {extremity exam:5109} Neurologic: {neuro exam:17854}   Assessment:   No diagnosis found.   Plan:   There are no diagnoses linked to this encounter.   Rubie Maid, MD [facility]

## 2022-01-08 ENCOUNTER — Ambulatory Visit: Payer: BC Managed Care – PPO | Admitting: Obstetrics and Gynecology

## 2022-01-08 ENCOUNTER — Encounter: Payer: Self-pay | Admitting: Obstetrics and Gynecology

## 2022-01-08 VITALS — BP 116/80 | HR 104 | Resp 16 | Ht 60.0 in | Wt 162.1 lb

## 2022-01-08 DIAGNOSIS — N8003 Adenomyosis of the uterus: Secondary | ICD-10-CM | POA: Diagnosis not present

## 2022-01-08 DIAGNOSIS — Z79899 Other long term (current) drug therapy: Secondary | ICD-10-CM | POA: Diagnosis not present

## 2022-01-08 DIAGNOSIS — N920 Excessive and frequent menstruation with regular cycle: Secondary | ICD-10-CM

## 2022-01-08 DIAGNOSIS — N951 Menopausal and female climacteric states: Secondary | ICD-10-CM | POA: Diagnosis not present

## 2022-01-09 LAB — HEPATIC FUNCTION PANEL
ALT: 16 IU/L (ref 0–32)
AST: 19 IU/L (ref 0–40)
Albumin: 4.8 g/dL (ref 3.8–4.8)
Alkaline Phosphatase: 99 IU/L (ref 44–121)
Bilirubin Total: 0.3 mg/dL (ref 0.0–1.2)
Bilirubin, Direct: 0.12 mg/dL (ref 0.00–0.40)
Total Protein: 7 g/dL (ref 6.0–8.5)

## 2022-03-06 ENCOUNTER — Encounter: Payer: Self-pay | Admitting: Obstetrics and Gynecology

## 2022-03-07 ENCOUNTER — Encounter: Payer: Self-pay | Admitting: Obstetrics and Gynecology

## 2022-03-09 MED ORDER — MEDROXYPROGESTERONE ACETATE 10 MG PO TABS: 10.0000 mg | ORAL_TABLET | Freq: Every day | ORAL | 2 refills | Status: DC

## 2022-03-23 ENCOUNTER — Other Ambulatory Visit: Payer: Self-pay | Admitting: Obstetrics and Gynecology

## 2022-07-07 ENCOUNTER — Encounter: Payer: Self-pay | Admitting: Obstetrics and Gynecology

## 2022-07-07 NOTE — Telephone Encounter (Signed)
Patient will come in today an pick up a sample of Orlissa 150 mg.

## 2022-07-09 ENCOUNTER — Other Ambulatory Visit: Payer: Self-pay

## 2022-07-09 ENCOUNTER — Encounter: Payer: Self-pay | Admitting: Obstetrics and Gynecology

## 2022-07-09 DIAGNOSIS — N8003 Adenomyosis of the uterus: Secondary | ICD-10-CM

## 2022-07-09 MED ORDER — ORILISSA 150 MG PO TABS: 1.0000 | ORAL_TABLET | Freq: Every day | ORAL | 3 refills | Status: DC

## 2022-10-27 ENCOUNTER — Other Ambulatory Visit: Payer: Self-pay

## 2022-10-27 NOTE — Telephone Encounter (Signed)
Pt called triage needing a sample of her BC because the pharmacy states its out of stock. Sample left at front pt aware.

## 2022-11-04 ENCOUNTER — Ambulatory Visit
Admission: RE | Admit: 2022-11-04 | Discharge: 2022-11-04 | Disposition: A | Discharge status: Home / Self Care | Payer: BC Managed Care – PPO | Source: Ambulatory Visit | Attending: Urology | Admitting: Urology

## 2022-11-04 DIAGNOSIS — N281 Cyst of kidney, acquired: Secondary | ICD-10-CM | POA: Diagnosis not present

## 2022-11-21 ENCOUNTER — Ambulatory Visit: Payer: BC Managed Care – PPO | Admitting: Urology

## 2022-11-21 ENCOUNTER — Encounter: Payer: Self-pay | Admitting: Urology

## 2022-11-21 VITALS — BP 122/81 | HR 91 | Ht 60.0 in | Wt 164.0 lb

## 2022-11-21 DIAGNOSIS — N3281 Overactive bladder: Secondary | ICD-10-CM

## 2022-11-21 DIAGNOSIS — Z87448 Personal history of other diseases of urinary system: Secondary | ICD-10-CM | POA: Diagnosis not present

## 2022-11-21 MED ORDER — SOLIFENACIN SUCCINATE 5 MG PO TABS: 5.0000 mg | ORAL_TABLET | Freq: Every day | ORAL | 3 refills | Status: DC

## 2022-11-21 NOTE — Progress Notes (Signed)
I, DeAsia L Maxie,acting as a scribe for Riki Altes, MD.,have documented all relevant documentation on the behalf of Riki Altes, MD,as directed by  Riki Altes, MD while in the presence of Riki Altes, MD.   I, Maysun Anabel Bene, acting as a scribe for Riki Altes, MD.,have documented all relevant documentation on the behalf of Riki Altes, MD,as directed by  Riki Altes, MD while in the presence of Riki Altes, MD.   11/21/2022 11:12 AM   Ardeth Sportsman [birth year] [id number]  Referring provider: Thornton Dales, PA-C [address] [place],  [place] [address]  Chief Complaint  Patient presents with   Follow-up    HPI: Donna Perry is a 48 y.o. female here for annual follow up for renal cyst and overactive bladder.  Doing well since last visit No bothersome LUTS Denies dysuria, gross hematuria Denies flank, abdominal or pelvic pain  Taking Solifenacin as needed. Follow up renal ultrasound performed 11/04/2022 showed that the left renal cyst was no longer present.  PMH: Past Medical History:  Diagnosis Date   Anemia    Hypertension    Seasonal allergies     Surgical History: Past Surgical History:  Procedure Laterality Date   ESSURE TUBAL LIGATION     HYSTEROSCOPY WITH D & C N/A 02/25/2019   Procedure: DILATATION AND CURETTAGE /HYSTEROSCOPY;  Surgeon: Hildred Laser, MD;  Location: [facility];  Service: Gynecology;  Laterality: N/A;   LAPAROSCOPIC BILATERAL SALPINGECTOMY Bilateral 02/25/2019   Procedure: LAPAROSCOPIC BILATERAL SALPINGECTOMY, REMOVAL OF ESSURE COIL,CYSTOSCOPY;  Surgeon: Hildred Laser, MD;  Location: [facility];  Service: Gynecology;  Laterality: Bilateral;   LEEP     LEG SURGERY Left     Home Medications:  Allergies as of 11/21/2022       Reactions   Elemental Sulfur    Sulfamethoxazole-trimethoprim Hives        Medication List        Accurate as of November 21, 2022 11:12 AM. If you have any questions, ask your nurse or  doctor.          APPLE CIDER VINEGAR PO Take by mouth.   B-12 PO Take by mouth.   ELDERBERRY PO Take 5 mLs by mouth daily.   EPINEPHrine 0.3 mg/0.3 mL Soaj injection Commonly known as: EPI-PEN Inject 0.3 mg into the muscle as needed for anaphylaxis.   ferrous sulfate 325 (65 FE) MG EC tablet Take 325 mg by mouth 2 (two) times a day.   fexofenadine 180 MG tablet Commonly known as: ALLEGRA Take 180 mg by mouth.   fluticasone 50 MCG/ACT nasal spray Commonly known as: FLONASE Place 1 spray into both nostrils daily as needed for allergies.   hydrochlorothiazide 25 MG tablet Commonly known as: HYDRODIURIL Take 25 mg by mouth daily.   ibuprofen 800 MG tablet Commonly known as: ADVIL Take 1 tablet (800 mg total) by mouth every 8 (eight) hours as needed.   levocetirizine 5 MG tablet Commonly known as: XYZAL Take 5 mg by mouth every evening.   loratadine 10 MG tablet Commonly known as: CLARITIN Take 10 mg by mouth daily.   losartan 50 MG tablet Commonly known as: COZAAR Take 1 tablet by mouth daily.   medroxyPROGESTERone 10 MG tablet Commonly known as: PROVERA Take 1 tablet (10 mg total) by mouth daily. Use for ten days   nystatin powder Commonly known as: MYCOSTATIN/NYSTOP Apply 1 Bottle topically 2 (two) times daily as needed (  IRRITATION).   Orilissa 150 MG Tabs Generic drug: Elagolix Sodium Take 1 tablet by mouth daily.   Potassium 99 MG Tabs Take 99 mg by mouth daily as needed (POTASSIUM LEVELS).   solifenacin 5 MG tablet Commonly known as: VESICARE Take 1 tablet (5 mg total) by mouth daily. As needed        Allergies:  Allergies  Allergen Reactions   Elemental Sulfur    Sulfamethoxazole-Trimethoprim Hives    Family History: Family History  Problem Relation Age of Onset   Cervical cancer Mother    Hypertension Mother    Lung cancer Father    Breast cancer Neg Hx    Heart failure Neg Hx    Seizures Neg Hx     Social History:  reports  that she has never smoked. She has never used smokeless tobacco. She reports current alcohol use. She reports that she does not use drugs.   Physical Exam: BP 122/81   Pulse 91   Ht 5' (1.524 m)   Wt 164 lb (74.4 kg)   BMI 32.03 kg/m   Constitutional:  Alert and oriented, No acute distress. HEENT: [place] AT, moist mucus membranes.  Trachea midline, no masses. Cardiovascular: No clubbing, cyanosis, or edema. Respiratory: Normal respiratory effort, no increased work of breathing. GI: Abdomen is soft, nontender, nondistended, no abdominal masses Skin: No rashes, bruises or suspicious lesions. Neurologic: Grossly intact, no focal deficits, moving all 4 extremities. Psychiatric: Normal mood and affect.  Pertinent Imaging: Renal ultrasound was personally reviewed and interpreted. Compared with her previous ultrasounds and agree it is no longer visualized.  EXAM: RENAL / URINARY TRACT ULTRASOUND COMPLETE   COMPARISON:  11/02/2020   FINDINGS: Right Kidney:   Renal measurements: 9.7 cm = volume: 94 mL. Echogenicity within normal limits. No mass or hydronephrosis visualized.   Left Kidney:   Renal measurements: 9.4 cm = volume: 124 mL. Echogenicity within normal limits. No mass or hydronephrosis visualized.   Bladder:   Appears normal for degree of bladder distention.   Other:   None.   IMPRESSION: Normal sonographic appearance of the kidneys. Previously seen left renal cyst is not identified on the current examination.     Electronically Signed   By: Acquanetta Belling M.D.   On: 11/04/2022 15:05  Assessment & Plan:    History of left renal cyst Not identified on recent follow-up ultrasound.  Overactive bladder Taking Solifenacin PRN, which was refilled. Follow-up as needed for medication refill or worsening symptoms.  I have reviewed the above documentation for accuracy and completeness, and I agree with the above.   Riki Altes, MD  [facility] [address] [place], [place] [address] [phone]

## 2023-02-09 ENCOUNTER — Other Ambulatory Visit: Payer: Self-pay | Admitting: Urology

## 2023-03-31 NOTE — Progress Notes (Unsigned)
GYNECOLOGY ANNUAL PHYSICAL EXAM PROGRESS NOTE  Subjective:    Donna Perry is a 47 y.o. G79P1001 female who presents for an annual exam. The patient is not currently sexually active. The patient participates in regular exercise: no. Has the patient ever been transfused or tattooed?: yes. The patient reports that there is not domestic violence in her life. Is excited about having her first grandchild delivered 5 months ago.   The patient has the following complaints today:  Has complaints of hip pain for the past several months.  Was evaluated by her PCP and had negative x-rays.  Notes that she is seeing a massage therapist which helps some.  Also attempting to do some stretching. Patient also reports that she is still dealing with occasional hot flashes.  Utilizing evening primrose oil and consuming cold fluids which helps.  Is also noting more weight gain as she thinks she may be entering menopause. Is currently still on Orilissa.   Menstrual History: Menarche age: 46 Patient's last menstrual period was 03/08/2022 (approximate).    Gynecologic History:  Contraception: None.  History of STI's: Denies Last Pap: 02/19/2021. Results were: normal. Notes h/o abnormal pap smears. Last mammogram: 06/11/2022. Results were: normal Last colonoscopy: 09/02/22 (FIT Stool Card): NEGATIVE    OB History  Gravida Para Term Preterm AB Living  1 1 1  0 0 1  SAB IAB Ectopic Multiple Live Births  0 0 0 0 1    # Outcome Date GA Lbr Len/2nd Weight Sex Type Anes PTL Lv  1 Term 39    M Vag-Spont   LIV    Past Medical History:  Diagnosis Date   Anemia    Hypertension    Seasonal allergies     Past Surgical History:  Procedure Laterality Date   ESSURE TUBAL LIGATION     HYSTEROSCOPY WITH D & C N/A 02/25/2019   Procedure: DILATATION AND CURETTAGE /HYSTEROSCOPY;  Surgeon: Hildred Laser, MD;  Location: [facility];  Service: Gynecology;  Laterality: N/A;   LAPAROSCOPIC BILATERAL SALPINGECTOMY  Bilateral 02/25/2019   Procedure: LAPAROSCOPIC BILATERAL SALPINGECTOMY, REMOVAL OF ESSURE COIL,CYSTOSCOPY;  Surgeon: Hildred Laser, MD;  Location: [facility];  Service: Gynecology;  Laterality: Bilateral;   LEEP     LEG SURGERY Left     Family History  Problem Relation Age of Onset   Cervical cancer Mother    Hypertension Mother    Lung cancer Father    Breast cancer Neg Hx    Heart failure Neg Hx    Seizures Neg Hx     Social History   Socioeconomic History   Marital status: Single    Spouse name: Not on file   Number of children: Not on file   Years of education: Not on file   Highest education level: Not on file  Occupational History   Not on file  Tobacco Use   Smoking status: Never   Smokeless tobacco: Never  Vaping Use   Vaping status: Never Used  Substance and Sexual Activity   Alcohol use: Yes    Comment: Socially   Drug use: No   Sexual activity: Yes    Birth control/protection: Surgical    Comment: tubal removal  Other Topics Concern   Not on file  Social History Narrative   Not on file   Social Determinants of Health   Financial Resource Strain: Low Risk  (08/15/2022)   Received from [facility], [facility]   Overall Financial Resource  Strain (CARDIA)    Difficulty of Paying Living Expenses: Not very hard  Food Insecurity: Food Insecurity Present (08/15/2022)   Received from [facility], [facility]   Hunger Vital Sign    Worried About Running Out of Food in the Last Year: Sometimes true    Ran Out of Food in the Last Year: Patient declined  Transportation Needs: No Transportation Needs (08/15/2022)   Received from [facility], [facility]   [facility] - Transportation    In the past 12 months, has lack of transportation kept you from medical appointments or from getting medications?: No    Lack of Transportation (Non-Medical): No  Physical  Activity: Not on file  Stress: Not on file  Social Connections: Not on file  Intimate Partner Violence: Not on file    Current Outpatient Medications on File Prior to Visit  Medication Sig Dispense Refill   APPLE CIDER VINEGAR PO Take by mouth.     Cyanocobalamin (B-12 PO) Take by mouth.     Elagolix Sodium (ORILISSA) 150 MG TABS Take 1 tablet by mouth daily. 90 tablet 3   ELDERBERRY PO Take 5 mLs by mouth daily.     EPINEPHrine 0.3 mg/0.3 mL IJ SOAJ injection Inject 0.3 mg into the muscle as needed for anaphylaxis.      ferrous sulfate 325 (65 FE) MG EC tablet Take 325 mg by mouth 2 (two) times a day.      fexofenadine (ALLEGRA) 180 MG tablet Take 180 mg by mouth.     fluticasone (FLONASE) 50 MCG/ACT nasal spray Place 1 spray into both nostrils daily as needed for allergies.      hydrochlorothiazide (HYDRODIURIL) 25 MG tablet Take 25 mg by mouth daily.      ibuprofen (ADVIL) 800 MG tablet Take 1 tablet (800 mg total) by mouth every 8 (eight) hours as needed. 30 tablet 1   levocetirizine (XYZAL) 5 MG tablet Take 5 mg by mouth every evening.     loratadine (CLARITIN) 10 MG tablet Take 10 mg by mouth daily.     medroxyPROGESTERone (PROVERA) 10 MG tablet Take 1 tablet (10 mg total) by mouth daily. Use for ten days 10 tablet 2   nystatin (MYCOSTATIN/NYSTOP) powder Apply 1 Bottle topically 2 (two) times daily as needed (IRRITATION).     Potassium 99 MG TABS Take 99 mg by mouth daily as needed (POTASSIUM LEVELS).     solifenacin (VESICARE) 5 MG tablet TAKE 1 TABLET(5 MG) BY MOUTH DAILY AS NEEDED 30 tablet 3   No current facility-administered medications on file prior to visit.    Allergies  Allergen Reactions   Elemental Sulfur    Sulfamethoxazole-Trimethoprim Hives     Review of Systems Constitutional: negative for chills, fatigue, fevers and sweats. Positive for weight gain.  Eyes: negative for irritation, redness and visual disturbance Ears, nose, mouth, throat, and face: negative  for hearing loss, nasal congestion, snoring and tinnitus Respiratory: negative for asthma, cough, sputum Cardiovascular: negative for chest pain, dyspnea, exertional chest pressure/discomfort, irregular heart beat, palpitations and syncope Gastrointestinal: negative for abdominal pain, change in bowel habits, nausea and vomiting Genitourinary: negative for abnormal menstrual periods, genital lesions, sexual problems and vaginal discharge, dysuria and urinary incontinence. Positive for hot flushes. Integument/breast: negative for breast lump, breast tenderness and nipple discharge Hematologic/lymphatic: negative for bleeding and easy bruising Musculoskeletal:negative for back pain and muscle weakness Neurological: negative for dizziness, headaches, vertigo and weakness Endocrine: negative for  diabetic symptoms including polydipsia, polyuria and skin dryness Allergic/Immunologic: negative for hay fever and urticaria      Objective:  Blood pressure (!) 122/91, pulse 95, resp. rate 16, height 5' (1.524 m), weight 161 lb 11.2 oz (73.3 kg), last menstrual period 03/08/2022.  Body mass index is 31.58 kg/m.    General Appearance:    Alert, cooperative, no distress, appears stated age, mild obesity  Head:    Normocephalic, without obvious abnormality, atraumatic  Eyes:    PERRL, conjunctiva/corneas clear, EOM's intact, both eyes  Ears:    Normal external ear canals, both ears  Nose:   Nares normal, septum midline, mucosa normal, no drainage or sinus tenderness  Throat:   Lips, mucosa, and tongue normal; teeth and gums normal  Neck:   Supple, symmetrical, trachea midline, no adenopathy; thyroid: no enlargement/tenderness/nodules; no carotid bruit or JVD  Back:     Symmetric, no curvature, ROM normal, no CVA tenderness  Lungs:     Clear to auscultation bilaterally, respirations unlabored  Chest Wall:    No tenderness or deformity   Heart:    Regular rate and rhythm, S1 and S2 normal, no murmur, rub  or gallop  Breast Exam:    No tenderness, masses, or nipple abnormality  Abdomen:     Soft, non-tender, bowel sounds active all four quadrants, no masses, no organomegaly.    Genitalia:    Pelvic:external genitalia normal, vagina without lesions, discharge, or tenderness, rectovaginal septum  normal. Cervix normal in appearance, no cervical motion tenderness, no adnexal masses or tenderness.  Uterus normal size, shape, mobile, regular contours, nontender.  Rectal:    Normal external sphincter.  No hemorrhoids appreciated. Internal exam not done.   Extremities:   Extremities normal, atraumatic, no cyanosis or edema  Pulses:   2+ and symmetric all extremities  Skin:   Skin color, texture, turgor normal, no rashes or lesions  Lymph nodes:   Cervical, supraclavicular, and axillary nodes normal  Neurologic:   CNII-XII intact, normal strength, sensation and reflexes throughout   .  Labs:  Lab Results  Component Value Date   WBC 8.2 02/07/2021   HGB 7.7 (L) 02/07/2021   HCT 23.8 (L) 02/07/2021   MCV 81.0 02/07/2021   PLT 414 (H) 02/07/2021    Lab Results  Component Value Date   CREATININE 0.50 02/07/2021   BUN 8 02/07/2021   NA 140 02/07/2021   K 4.0 02/07/2021   CL 103 02/07/2021   CO2 29 02/07/2021    Lab Results  Component Value Date   ALT 16 01/08/2022   AST 19 01/08/2022   ALKPHOS 99 01/08/2022   BILITOT 0.3 01/08/2022    Lab Results  Component Value Date   TSH 0.764 02/19/2021     Assessment:   1. Encounter for well woman exam with routine gynecological exam   2. Encounter for screening mammogram for malignant neoplasm of breast   3. Iron deficiency anemia due to chronic blood loss   4. Screening for diabetes mellitus (DM)   5. Screening cholesterol level   6. Adenomyosis   7. Perimenopausal vasomotor symptoms    Plan:     1. Encounter for well woman exam with routine gynecological exam - Contraception: None - Discussed healthy lifestyle modifications. -  Pap smear UTD. - Blood tests: Ordered (CBC, Comprehensive metabolic panel, Hemoglobin A1c, Lipid panel) -Encourage all routine screenings and vaccinations -Counseled on colon cancer screening.  Currently performing yearly FIT screens.  Advised on option  of Cologuard use.  Patient is otherwise low risk.  Can also consider colonoscopy at some later point in the future as well.  Given handout.  2. Encounter for screening mammogram for malignant neoplasm of breast - Breast self exam technique reviewed and patient encouraged to perform self-exam monthly. - MM 3D SCREENING MAMMOGRAM BILATERAL BREAST  3. Iron deficiency anemia due to chronic blood loss - History of anemia, most likely improved as patient has been amenorrheic for the past year.  - CBC ordered.   4. Screening for diabetes mellitus (DM) - Hemoglobin A1c  5. Screening cholesterol level - Lipid panel  6. Adenomyosis - Continue Dewayne Hatch for total of 2 years. By this time, patient hopefully will be transitioning to menopausal status.   7. Perimenopausal vasomotor symptoms - Currently managing with evening primrose oil and lifestyle/behavioral modifications. Symptoms may be a combination of perimenopause as well as side effect from [place] medication discussed option of adding alternative supplementation with hormone progesterone however patient desires to continue with current management regimen at this time  Follow up in 1 year for annual exam.  Hildred Laser, MD [facility]

## 2023-04-01 ENCOUNTER — Encounter: Payer: Self-pay | Admitting: Obstetrics and Gynecology

## 2023-04-01 ENCOUNTER — Ambulatory Visit (INDEPENDENT_AMBULATORY_CARE_PROVIDER_SITE_OTHER): Payer: BC Managed Care – PPO | Admitting: Obstetrics and Gynecology

## 2023-04-01 VITALS — BP 122/91 | HR 95 | Resp 16 | Ht 60.0 in | Wt 161.7 lb

## 2023-04-01 DIAGNOSIS — Z01419 Encounter for gynecological examination (general) (routine) without abnormal findings: Secondary | ICD-10-CM | POA: Diagnosis not present

## 2023-04-01 DIAGNOSIS — Z1231 Encounter for screening mammogram for malignant neoplasm of breast: Secondary | ICD-10-CM

## 2023-04-01 DIAGNOSIS — N8003 Adenomyosis of the uterus: Secondary | ICD-10-CM

## 2023-04-01 DIAGNOSIS — Z131 Encounter for screening for diabetes mellitus: Secondary | ICD-10-CM

## 2023-04-01 DIAGNOSIS — Z13220 Encounter for screening for lipoid disorders: Secondary | ICD-10-CM

## 2023-04-01 DIAGNOSIS — N951 Menopausal and female climacteric states: Secondary | ICD-10-CM

## 2023-04-01 DIAGNOSIS — D500 Iron deficiency anemia secondary to blood loss (chronic): Secondary | ICD-10-CM

## 2023-04-02 LAB — CBC
Hematocrit: 42 % (ref 34.0–46.6)
Hemoglobin: 13.7 g/dL (ref 11.1–15.9)
MCH: 27.8 pg (ref 26.6–33.0)
MCHC: 32.6 g/dL (ref 31.5–35.7)
MCV: 85 fL (ref 79–97)
Platelets: 347 10*3/uL (ref 150–450)
RBC: 4.93 x10E6/uL (ref 3.77–5.28)
RDW: 12.6 % (ref 11.7–15.4)
WBC: 7.1 10*3/uL (ref 3.4–10.8)

## 2023-04-02 LAB — LIPID PANEL
Chol/HDL Ratio: 3.2 ratio (ref 0.0–4.4)
Cholesterol, Total: 181 mg/dL (ref 100–199)
HDL: 56 mg/dL (ref >39)
LDL Chol Calc (NIH): 105 mg/dL — ABNORMAL HIGH (ref 0–99)
Triglycerides: 111 mg/dL (ref 0–149)
VLDL Cholesterol Cal: 20 mg/dL (ref 5–40)

## 2023-04-02 LAB — HEMOGLOBIN A1C
Est. average glucose Bld gHb Est-mCnc: 123 mg/dL
Hgb A1c MFr Bld: 5.9 % — ABNORMAL HIGH (ref 4.8–5.6)

## 2023-04-02 LAB — COMPREHENSIVE METABOLIC PANEL
ALT: 15 IU/L (ref 0–32)
AST: 17 IU/L (ref 0–40)
Albumin: 4.8 g/dL (ref 3.9–4.9)
Alkaline Phosphatase: 96 IU/L (ref 44–121)
BUN/Creatinine Ratio: 12 (ref 9–23)
BUN: 9 mg/dL (ref 6–24)
Bilirubin Total: 0.6 mg/dL (ref 0.0–1.2)
CO2: 25 mmol/L (ref 20–29)
Calcium: 9.9 mg/dL (ref 8.7–10.2)
Chloride: 99 mmol/L (ref 96–106)
Creatinine, Ser: 0.73 mg/dL (ref 0.57–1.00)
Globulin, Total: 2.5 g/dL (ref 1.5–4.5)
Glucose: 95 mg/dL (ref 70–99)
Potassium: 4.1 mmol/L (ref 3.5–5.2)
Sodium: 141 mmol/L (ref 134–144)
Total Protein: 7.3 g/dL (ref 6.0–8.5)
eGFR: 103 mL/min/{1.73_m2} (ref >59)

## 2023-04-09 ENCOUNTER — Encounter: Payer: Self-pay | Admitting: Obstetrics and Gynecology

## 2023-04-27 ENCOUNTER — Telehealth: Payer: Self-pay

## 2023-04-30 ENCOUNTER — Encounter: Payer: Self-pay | Admitting: Obstetrics and Gynecology

## 2023-05-06 NOTE — Telephone Encounter (Signed)
PA has been Denied. Patient is aware. Coming in to pick up samples and make appointment to discuss other options.

## 2023-05-10 ENCOUNTER — Other Ambulatory Visit: Payer: Self-pay | Admitting: Urology

## 2023-05-11 ENCOUNTER — Other Ambulatory Visit: Payer: Self-pay | Admitting: Urology

## 2023-05-14 ENCOUNTER — Ambulatory Visit: Payer: BC Managed Care – PPO | Admitting: Obstetrics and Gynecology

## 2023-05-14 VITALS — BP 122/81 | HR 77 | Resp 16 | Ht 60.0 in | Wt 163.9 lb

## 2023-05-14 DIAGNOSIS — Z862 Personal history of diseases of the blood and blood-forming organs and certain disorders involving the immune mechanism: Secondary | ICD-10-CM

## 2023-05-14 DIAGNOSIS — N8003 Adenomyosis of the uterus: Secondary | ICD-10-CM | POA: Diagnosis not present

## 2023-05-14 DIAGNOSIS — N920 Excessive and frequent menstruation with regular cycle: Secondary | ICD-10-CM

## 2023-05-14 DIAGNOSIS — N951 Menopausal and female climacteric states: Secondary | ICD-10-CM

## 2023-05-14 NOTE — Progress Notes (Signed)
    GYNECOLOGY PROGRESS NOTE  Subjective:    Patient ID: Donna Perry, female    DOB: 1975-12-08, 47 y.o.   MRN: [id number]  HPI  Patient is a 47 y.o. G29P1001 female who presents for medication management. She has a PMH of dysfunctional uterine bleeding, adenomyosis/endometriosis, iron deficiency anemia, and perimenopausal vasomotor symptoms. She was using Dewayne Hatch to manage her diagnosis. She reports that Pollie Friar is a "miracle drug" for her. She has been using [place] for slightly over 2 years and now must discontinue as she has exceeded recommended length of time frame to take the medication, and insurance is no longer covering it.  Today she is here to discuss alternative medication therapy.  The following portions of the patient's history were reviewed and updated as appropriate: allergies, current medications, past family history, past medical history, past social history, past surgical history, and problem list.  Review of Systems Pertinent items noted in HPI and remainder of comprehensive ROS otherwise negative.   Objective:   Blood pressure 122/81, pulse 77, resp. rate 16, height 5' (1.524 m), weight 163 lb 14.4 oz (74.3 kg). Body mass index is 32.01 kg/m. General appearance: alert and no distress Remainder of exam deferred.    Assessment:   1. Adenomyosis   2. Perimenopausal vasomotor symptoms   3. Menorrhagia with regular cycle   4. History of anemia      Plan:   -Discussed plan of care at this point in time.  Patient notes symptoms have been suppressed while utilizing the medication.  Advised that it is recommended to take at least a 77-month hiatus from the medication due to the risk of reversible bone loss with extended therapeutic use.  Patient notes understanding.  I advised that most patients who discontinue the medication still remain symptom-free for at least 6 months to a year after stopping their medication.  If she should happen to have symptoms that recur, can  consider resumption of the medication versus an alternative medication such as Myfembree at that time.  Also discussed that if she were extremely concerned about recurrence of her symptoms sooner, could utilize suppression with progesterone such as Aygestin in the meantime.  Patient notes that she is okay to wait and see for now.  Will follow-up in 3 months if her symptoms have resumed.    Hildred Laser, MD [facility]

## 2023-06-07 ENCOUNTER — Encounter: Payer: Self-pay | Admitting: Obstetrics and Gynecology

## 2023-06-09 ENCOUNTER — Encounter: Payer: Self-pay | Admitting: Obstetrics

## 2023-06-09 ENCOUNTER — Ambulatory Visit: Payer: BC Managed Care – PPO | Admitting: Obstetrics

## 2023-06-09 VITALS — BP 122/73 | HR 96 | Ht 60.0 in | Wt 162.0 lb

## 2023-06-09 DIAGNOSIS — R8290 Unspecified abnormal findings in urine: Secondary | ICD-10-CM

## 2023-06-09 LAB — POCT URINALYSIS DIPSTICK
Appearance: NORMAL
Bilirubin, UA: NEGATIVE
Blood, UA: NEGATIVE
Glucose, UA: NEGATIVE
Ketones, UA: NEGATIVE
Leukocytes, UA: NEGATIVE
Nitrite, UA: NEGATIVE
Protein, UA: NEGATIVE
Spec Grav, UA: 1.01 (ref 1.010–1.025)
Urobilinogen, UA: 1 U/dL
pH, UA: 6 (ref 5.0–8.0)

## 2023-06-09 NOTE — Progress Notes (Signed)
    GYNECOLOGY PROGRESS NOTE  Subjective:  PCP: Thornton Dales, PA-C  Patient ID: Donna Perry, female    DOB: January 28, 1976, 47 y.o.   MRN: [id number]  HPI  Patient is a 47 y.o. G46P1001 female who presents for  urine odor. This began a few weeks ago, almost immediately after she completed a 46yr course of Orilissa. She states it smells like cheerios, is not foul, denies hematuria, dysuria, but did experience frequent urination, was started on Vesacare and no longer has the frequency. She's  beginning to be a little worried because it's been about 46 weeks and the smell is still there. She denies vaginal discharge, odor, itching, burning, and is not sexually active.   The following portions of the patient's history were reviewed and updated as appropriate: allergies, current medications, past family history, past medical history, past social history, past surgical history, and problem list.  Review of Systems Pertinent items are noted in HPI.   Objective:   Blood pressure 122/73, pulse 96, height 5' (1.524 m), weight 162 lb (73.5 kg). Body mass index is 31.64 kg/m.  General appearance: alert and cooperative Respiratory: normal work of breathing Extremities: extremities normal, atraumatic, no cyanosis or edema Neurologic: Grossly normal  Results for orders placed or performed in visit on 06/09/23 (from the past 24 hour(s))  POCT urinalysis dipstick     Status: Normal   Collection Time: 06/09/23  2:38 PM  Result Value Ref Range   Color, UA yellow    Clarity, UA clear    Glucose, UA Negative Negative   Bilirubin, UA Negative    Ketones, UA Negative    Spec Grav, UA 1.010 1.010 - 1.025   Blood, UA Negative    pH, UA 6.0 5.0 - 8.0   Protein, UA Negative Negative   Urobilinogen, UA 1.0 0.2 or 1.0 E.U./dL   Nitrite, UA Negative    Leukocytes, UA Negative Negative   Appearance normal    Odor none      Assessment/Plan:   1. Abnormal urine odor   Urine today without any findings. Suspect  the shift in odor is due to coming off the [place]. Reassurance given and discussed signs and symptoms that would warrant further evaluation. Follow up as needed.    Julieanne Manson, DO [facility]

## 2023-06-16 ENCOUNTER — Ambulatory Visit
Admission: RE | Admit: 2023-06-16 | Discharge: 2023-06-16 | Disposition: A | Discharge status: Home / Self Care | Payer: BC Managed Care – PPO | Source: Ambulatory Visit | Attending: Obstetrics and Gynecology | Admitting: Obstetrics and Gynecology

## 2023-06-16 DIAGNOSIS — Z01419 Encounter for gynecological examination (general) (routine) without abnormal findings: Secondary | ICD-10-CM | POA: Diagnosis present

## 2023-06-16 DIAGNOSIS — Z1231 Encounter for screening mammogram for malignant neoplasm of breast: Secondary | ICD-10-CM | POA: Insufficient documentation

## 2023-06-17 ENCOUNTER — Inpatient Hospital Stay
Admission: RE | Admit: 2023-06-17 | Discharge: 2023-06-17 | Disposition: A | Discharge status: Home / Self Care | Payer: Self-pay | Source: Ambulatory Visit | Attending: Family Medicine | Admitting: Family Medicine

## 2023-06-17 ENCOUNTER — Other Ambulatory Visit: Payer: Self-pay | Admitting: *Deleted

## 2023-06-17 DIAGNOSIS — Z1231 Encounter for screening mammogram for malignant neoplasm of breast: Secondary | ICD-10-CM

## 2023-06-21 ENCOUNTER — Encounter: Payer: Self-pay | Admitting: Obstetrics and Gynecology

## 2023-08-25 NOTE — Progress Notes (Unsigned)
    GYNECOLOGY PROGRESS NOTE  Subjective:    Patient ID: Donna Perry, female    DOB: [birth year], 48 y.o.   MRN: [id number]  HPI  Patient is a 48 y.o. G46P1001 female who presents for follow up for Menorrhagia with regular cycles. She has been treating menorrhagia with Dewayne Hatch for 24 months. Recommended to take at least a 43-month hiatus from the medication due to the risk of reversible bone loss with extended therapeutic use. She stopped Orilissa in November, cycles have been fairly manageable since stopping medication. Does note that this past mont she had 2 cycles, ~ 10 days apart.  Initial cycle was 5 days, with small clotting and cramping. Second cycle lasted for 7 days with minimal clotting. Notes that she has begun cramping again recently, thinks she is due to have a cycle soon and would like to continue therapy or alternatives if available.  The following portions of the patient's history were reviewed and updated as appropriate: allergies, current medications, past family history, past medical history, past social history, past surgical history, and problem list.  Review of Systems Pertinent items noted in HPI and remainder of comprehensive ROS otherwise negative.   Objective:   Blood pressure 117/78, pulse 87, resp. rate 16, height 5' (1.524 m), weight 164 lb 3.2 oz (74.5 kg), last menstrual period 08/01/2023.  Body mass index is 32.07 kg/m. General appearance: alert and no distress Abdomen: soft, non-tender; bowel sounds normal; no masses,  no organomegaly Pelvic: exam deferred.    Assessment:   1. Menorrhagia with regular cycle   2. History of anemia   3. Adenomyosis      Plan:   - Reviewed alternative options, including trial of Myfembree vs Aygestin. Discussed risks/benefits of each. Patient ok to trial Aygestin. Will prescribe.  - RTC in 2 months to reassess symptoms.     Hildred Laser, MD [facility]

## 2023-08-26 ENCOUNTER — Ambulatory Visit: Payer: BC Managed Care – PPO | Admitting: Obstetrics and Gynecology

## 2023-08-26 ENCOUNTER — Encounter: Payer: Self-pay | Admitting: Obstetrics and Gynecology

## 2023-08-26 VITALS — BP 117/78 | HR 87 | Resp 16 | Ht 60.0 in | Wt 164.2 lb

## 2023-08-26 DIAGNOSIS — N8003 Adenomyosis of the uterus: Secondary | ICD-10-CM

## 2023-08-26 DIAGNOSIS — Z862 Personal history of diseases of the blood and blood-forming organs and certain disorders involving the immune mechanism: Secondary | ICD-10-CM | POA: Diagnosis not present

## 2023-08-26 DIAGNOSIS — N920 Excessive and frequent menstruation with regular cycle: Secondary | ICD-10-CM

## 2023-08-26 MED ORDER — NORETHINDRONE ACETATE 5 MG PO TABS: 5.0000 mg | ORAL_TABLET | Freq: Every day | ORAL | 3 refills | Status: DC

## 2023-10-16 NOTE — Progress Notes (Addendum)
    GYNECOLOGY PROGRESS NOTE  Subjective:    Patient ID: Donna Perry, female    DOB: 02/04/1976, 48 y.o.   MRN: [id number]  HPI  Patient is a 48 y.o. G34P1001 female who presents for follow up for abnormal uterine bleeding. She has a history of menorrhagia, adenomyosis or suspected endometriosis, and anemia. At her previous visit she agreed to trial Aygestin, she notes that it has been rough over the last two months since she started it. Started spotting at the beginning of February.  Her LMP was 03/03 and it ended on 03/16. Flow was moderate and consistent with some clotting, she also had some cramping that she managed with Ibuprofen. Also began having more issues with overactive bladder, started taking Vesicare which has helped. Having cramping and night sweats. Was previously on Orilissa for 2 years, helped to manage her symptoms, ended therapy in October 2024. Symptoms began to return in January of this year.   The following portions of the patient's history were reviewed and updated as appropriate: allergies, current medications, past family history, past medical history, past social history, past surgical history, and problem list.  Review of Systems Pertinent items are noted in HPI.   Objective:   Blood pressure 126/76, pulse (!) 101, resp. rate 16, height 5' (1.524 m), weight 165 lb (74.8 kg), last menstrual period 10/05/2023. Body mass index is 32.22 kg/m. General appearance: alert, cooperative, and no distress Remainder of exam deferred.    Labs:  Results for orders placed or performed in visit on 10/20/23  POCT Urinalysis Dipstick   Collection Time: 10/20/23 10:06 AM  Result Value Ref Range   Color, UA     Clarity, UA     Glucose, UA Negative Negative   Bilirubin, UA Negative    Ketones, UA Negative    Spec Grav, UA 1.010 1.010 - 1.025   Blood, UA Negative    pH, UA 6.5 5.0 - 8.0   Protein, UA Negative Negative   Urobilinogen, UA 0.2 0.2 or 1.0 E.U./dL   Nitrite, UA  Negative    Leukocytes, UA Negative Negative   Appearance     Odor       Assessment:   1. Menorrhagia with regular cycle   2. History of anemia   3. Adenomyosis   4. Overactive bladder   5. Other endometriosis      Plan:   - Discussed management options as patient is not thriving with trial of Aygestin (although on further review, appears that she may have received a different generic from her pharmacy, [facility]).  Other options include switching to Myfembree, initiation of Slynd (would likely prescribe in continuous fashion), or consideration of IUD. Patient notes she would like to try the Myfembree. Samples given, prescription sent to her pharmacy.  - Patient with OAB symptoms, desires urine to be checked as she has also been noting on and off odor. Otherwise managing with Vesicare. UA negative today.     Hildred Laser, MD [facility]

## 2023-10-20 ENCOUNTER — Encounter: Payer: Self-pay | Admitting: Obstetrics and Gynecology

## 2023-10-20 ENCOUNTER — Ambulatory Visit: Payer: BC Managed Care – PPO | Admitting: Obstetrics and Gynecology

## 2023-10-20 ENCOUNTER — Encounter: Payer: Self-pay | Admitting: Obstetrics

## 2023-10-20 VITALS — BP 126/76 | HR 101 | Resp 16 | Ht 60.0 in | Wt 165.0 lb

## 2023-10-20 DIAGNOSIS — N3281 Overactive bladder: Secondary | ICD-10-CM | POA: Diagnosis not present

## 2023-10-20 DIAGNOSIS — N8003 Adenomyosis of the uterus: Secondary | ICD-10-CM

## 2023-10-20 DIAGNOSIS — N808 Other endometriosis: Secondary | ICD-10-CM

## 2023-10-20 DIAGNOSIS — N920 Excessive and frequent menstruation with regular cycle: Secondary | ICD-10-CM | POA: Diagnosis not present

## 2023-10-20 DIAGNOSIS — Z862 Personal history of diseases of the blood and blood-forming organs and certain disorders involving the immune mechanism: Secondary | ICD-10-CM

## 2023-10-20 LAB — POCT URINALYSIS DIPSTICK
Bilirubin, UA: NEGATIVE
Blood, UA: NEGATIVE
Glucose, UA: NEGATIVE
Ketones, UA: NEGATIVE
Leukocytes, UA: NEGATIVE
Nitrite, UA: NEGATIVE
Protein, UA: NEGATIVE
Spec Grav, UA: 1.01 (ref 1.010–1.025)
Urobilinogen, UA: 0.2 U/dL
pH, UA: 6.5 (ref 5.0–8.0)

## 2023-10-20 MED ORDER — MYFEMBREE 40-1-0.5 MG PO TABS: 1.0000 | ORAL_TABLET | Freq: Every day | ORAL | 11 refills | Status: DC

## 2023-10-21 ENCOUNTER — Telehealth: Payer: Self-pay

## 2023-10-21 NOTE — Telephone Encounter (Signed)
 Marland Kitchen

## 2023-10-23 ENCOUNTER — Encounter: Payer: Self-pay | Admitting: Obstetrics and Gynecology

## 2023-10-30 ENCOUNTER — Telehealth: Payer: Self-pay | Admitting: Obstetrics and Gynecology

## 2023-10-30 ENCOUNTER — Other Ambulatory Visit: Payer: Self-pay

## 2023-10-30 DIAGNOSIS — N920 Excessive and frequent menstruation with regular cycle: Secondary | ICD-10-CM

## 2023-10-30 MED ORDER — MYFEMBREE 40-1-0.5 MG PO TABS
1.0000 | ORAL_TABLET | Freq: Every day | ORAL | 11 refills | Status: AC
Start: 2023-10-30 — End: ?

## 2023-10-30 NOTE — Telephone Encounter (Signed)
 This pt wanted me to send you a message-You sent in Authorization for [employer].  They reached out to her and let her know that her medication would be out network.  She is wanting to know what  can be done.

## 2023-11-02 NOTE — Telephone Encounter (Signed)
 What do you advise about her Myfembree. Her prior authorization has been denied by her Insurance. I sent it to [facility] and it has been denied. I put some samples at the front desk for her to pick up. Please advise.  CB

## 2023-11-02 NOTE — Telephone Encounter (Signed)
 I have added endometriosis NOS to her diagnosis and last note.  Can resubmit prior auth with new information.

## 2023-11-09 NOTE — Telephone Encounter (Signed)
 Myfembree has been approved. Patient and pharmacy aware. See separate telephone encounter for authorization info.

## 2023-11-09 NOTE — Telephone Encounter (Signed)
 Patient aware. She requests to fill it at [facility]. Advised I will contact them and request the transfer.

## 2023-11-09 NOTE — Telephone Encounter (Signed)
 Spoke with Verda Cumins, requested transfer of Myfembree rx from [facility]. They will obtain transfer and notify patient when rx is ready.

## 2023-11-09 NOTE — Telephone Encounter (Signed)
 Donna Perry

## 2023-11-09 NOTE — Telephone Encounter (Signed)
 Patient aware.

## 2023-11-09 NOTE — Telephone Encounter (Signed)
 Notified [facility] prior authorization approved. They advised [facility] is out of network for patient's insurance and patient is aware she will need to have the rx transferred to an in network pharmacy.

## 2024-05-16 ENCOUNTER — Other Ambulatory Visit: Payer: Self-pay | Admitting: Family Medicine

## 2024-05-16 DIAGNOSIS — Z1231 Encounter for screening mammogram for malignant neoplasm of breast: Secondary | ICD-10-CM

## 2024-06-06 ENCOUNTER — Encounter: Payer: Self-pay | Admitting: Obstetrics

## 2024-06-16 ENCOUNTER — Ambulatory Visit
Admission: RE | Admit: 2024-06-16 | Discharge: 2024-06-16 | Disposition: A | Discharge status: Home / Self Care | Source: Ambulatory Visit | Attending: Family Medicine | Admitting: Family Medicine

## 2024-06-16 DIAGNOSIS — Z1231 Encounter for screening mammogram for malignant neoplasm of breast: Secondary | ICD-10-CM | POA: Diagnosis present

## 2024-07-15 NOTE — Progress Notes (Signed)
 ANNUAL PREVENTATIVE CARE GYNECOLOGY  ENCOUNTER NOTE  SUBJECTIVE:       Donna Perry is a 48 y.o. G57P1001 female here for a routine annual gynecologic exam. The patient is not sexually active. The patient is taking hormone replacement therapy. Patient denies post-menopausal vaginal bleeding. Family history of breast, uterine, ovarian cancer: yes. The patient wears seatbelts: yes. The patient participates in regular exercise: no. Has the patient ever been transfused or tattooed?: yes. The patient reports that there is not domestic violence in her life. Has the patient completed the Gardasil vaccine? no.  Current complaints: 1.  Recently finished amoxicillin  for sinus infection and has been having urinary urgency ever since.  She usually takes vesicare  for this and feels she may need to start again.  2. Myfembree  since April '25, amenorrheic on this and without perimenopausal symptoms, but decreased libido, which is tolerable for her.    Gynecologic History No LMP recorded. (Menstrual status: Other). Contraception: tubal ligation Last Pap: 02/19/2021. Results were: normal History of abnormal pap: years ago, had a colposcopy that was normal.  No abnormal since. History of STIs: denies Last Mammogram: 06/20/2024. Results were: normal Last Colonoscopy: Cologuard 2025 Last Dexa Scan: NA  PHQ-2:     07/21/2024    9:50 AM 05/14/2023    1:34 PM  Depression screen PHQ 2/9  Decreased Interest 0 0  Down, Depressed, Hopeless 0 0  PHQ - 2 Score 0 0    Obstetric History OB History  Gravida Para Term Preterm AB Living  1 1 1   1   SAB IAB Ectopic Multiple Live Births      1    # Outcome Date GA Lbr Len/2nd Weight Sex Type Anes PTL Lv  1 Term 32    M Vag-Spont   LIV    Past Medical History:  Diagnosis Date   Anemia    Hypertension    Seasonal allergies     Family History  Problem Relation Age of Onset   Cervical cancer Mother    Hypertension Mother    Lung cancer Father     Breast cancer Neg Hx    Heart failure Neg Hx    Seizures Neg Hx     Past Surgical History:  Procedure Laterality Date   ESSURE TUBAL LIGATION     HYSTEROSCOPY WITH D & C N/A 02/25/2019   Procedure: DILATATION AND CURETTAGE /HYSTEROSCOPY;  Surgeon: Connell Davies, MD;  Location: [facility] ORS;  Service: Gynecology;  Laterality: N/A;   LAPAROSCOPIC BILATERAL SALPINGECTOMY Bilateral 02/25/2019   Procedure: LAPAROSCOPIC BILATERAL SALPINGECTOMY, REMOVAL OF ESSURE COIL,CYSTOSCOPY;  Surgeon: Connell Davies, MD;  Location: [facility] ORS;  Service: Gynecology;  Laterality: Bilateral;   LEEP     LEG SURGERY Left     Social History   Socioeconomic History   Marital status: Single    Spouse name: Not on file   Number of children: Not on file   Years of education: Not on file   Highest education level: Not on file  Occupational History   Not on file  Tobacco Use   Smoking status: Never   Smokeless tobacco: Never  Vaping Use   Vaping status: Never Used  Substance and Sexual Activity   Alcohol use: Yes    Comment: Socially   Drug use: No   Sexual activity: Yes    Birth control/protection: Surgical    Comment: tubal removal  Other Topics Concern   Not on file  Social History Narrative   Not  on file   Social Drivers of Health   Tobacco Use: Low Risk (07/21/2024)   Patient History    Smoking Tobacco Use: Never    Smokeless Tobacco Use: Never    Passive Exposure: Not on file  Financial Resource Strain: Medium Risk (08/11/2023)   Received from [facility]   Overall Financial Resource Strain (CARDIA)    Difficulty of Paying Living Expenses: Somewhat hard  Food Insecurity: Food Insecurity Present (08/11/2023)   Received from [facility]   Epic    Within the past 12 months, you worried that your food would run out before you got the money to buy more.: Sometimes true    Within the past 12 months, the food you bought just didn't last and you didn't have money to  get more.: Sometimes true  Transportation Needs: No Transportation Needs (08/11/2023)   Received from [facility] - Transportation    In the past 12 months, has lack of transportation kept you from medical appointments or from getting medications?: No    Lack of Transportation (Non-Medical): No  Physical Activity: Not on file  Stress: Not on file  Social Connections: Not on file  Intimate Partner Violence: Not on file  Depression (PHQ2-9): Low Risk (07/21/2024)   Depression (PHQ2-9)    PHQ-2 Score: 0  Alcohol Screen: Not on file  Housing: Low Risk  (08/11/2023)   Received from [facility]   Epic    In the last 12 months, was there a time when you were not able to pay the mortgage or rent on time?: No    In the past 12 months, how many times have you moved where you were living?: 0    At any time in the past 12 months, were you homeless or living in a shelter (including now)?: No  Utilities: Not At Risk (08/11/2023)   Received from [facility]    Threatened with loss of utilities: No  Health Literacy: Not on file    Medications Ordered Prior to Encounter[1]  Allergies[2]   Review of Systems ROS Review of Systems - General ROS: negative for - chills, fatigue, fever, hot flashes, night sweats, weight gain or weight loss Psychological ROS: negative for - anxiety, decreased libido, depression, mood swings, physical abuse or sexual abuse Ophthalmic ROS: negative for - blurry vision, eye pain or loss of vision ENT ROS: negative for - headaches, hearing change, visual changes or vocal changes Allergy and Immunology ROS: negative for - hives, itchy/watery eyes or seasonal allergies Hematological and Lymphatic ROS: negative for - bleeding problems, bruising, swollen lymph nodes or weight loss Endocrine ROS: negative for - galactorrhea, hair pattern changes, hot flashes, malaise/lethargy, mood swings, palpitations,  polydipsia/polyuria, skin changes, temperature intolerance or unexpected weight changes Breast ROS: negative for - new or changing breast lumps or nipple discharge Respiratory ROS: negative for - cough or shortness of breath Cardiovascular ROS: negative for - chest pain, irregular heartbeat, palpitations or shortness of breath Gastrointestinal ROS: no abdominal pain, change in bowel habits, or black or bloody stools Genito-Urinary ROS: no dysuria, trouble voiding, or hematuria Musculoskeletal ROS: negative for - joint pain or joint stiffness Neurological ROS: negative for - bowel and bladder control changes Dermatological ROS: negative for rash and skin lesion changes   OBJECTIVE:   BP 128/87   Pulse 93   Wt 158 lb 3.2 oz (71.8 kg)   BMI  30.90 kg/m   CONSTITUTIONAL: Well-developed, well-nourished female in no acute distress.  PSYCHIATRIC: Normal mood and affect. Normal behavior. Normal judgment and thought content. NEUROLGIC: Alert and oriented to person, place, and time. Normal muscle tone coordination. No cranial nerve deficit noted. HENT:  Normocephalic, atraumatic, External right and left ear normal. Oropharynx is clear and moist EYES: Conjunctivae and EOM are normal. No scleral icterus.  NECK: Normal range of motion, supple, no masses.  Normal thyroid .  SKIN: Skin is warm and dry. No rash noted. Not diaphoretic. No erythema. No pallor. CARDIOVASCULAR: Normal heart rate noted, regular rhythm, no murmur. RESPIRATORY: Clear to auscultation bilaterally. Effort and breath sounds normal, no problems with respiration noted. BREASTS: Symmetric in size. No masses, skin changes, nipple drainage, or lymphadenopathy. ABDOMEN: Soft, normal bowel sounds, no distention noted.  No tenderness, rebound or guarding.  PELVIC:  Bladder no bladder distension noted  Urethra: normal appearing urethra with no masses, tenderness or lesions  Vulva: normal appearing vulva with no masses, tenderness or  lesions  Vagina: normal appearing vagina with normal color; thick white discharge, no lesions  Cervix: normal appearing cervix without discharge or lesions and cervix very anteverted and to pt's  right  Uterus: uterus is normal size, shape, consistency and nontender and retroverted  Adnexa: normal adnexa in size, nontender and no masses  RV: External Exam NormaI, No Rectal Masses, and Normal Sphincter tone  MUSCULOSKELETAL: Normal range of motion. No tenderness.  No cyanosis, clubbing, or edema.  2+ distal pulses. LYMPHATIC: No Axillary, Supraclavicular, or Inguinal Adenopathy.  Labs: Lab Results  Component Value Date   WBC 7.1 04/01/2023   HGB 13.7 04/01/2023   HCT 42.0 04/01/2023   MCV 85 04/01/2023   PLT 347 04/01/2023    Lab Results  Component Value Date   CREATININE 0.73 04/01/2023   BUN 9 04/01/2023   NA 141 04/01/2023   K 4.1 04/01/2023   CL 99 04/01/2023   CO2 25 04/01/2023    Lab Results  Component Value Date   ALT 15 04/01/2023   AST 17 04/01/2023   ALKPHOS 96 04/01/2023   BILITOT 0.6 04/01/2023    Lab Results  Component Value Date   CHOL 181 04/01/2023   HDL 56 04/01/2023   LDLCALC 105 (H) 04/01/2023   TRIG 111 04/01/2023   CHOLHDL 3.2 04/01/2023    Lab Results  Component Value Date   TSH 0.764 02/19/2021    Lab Results  Component Value Date   HGBA1C 5.9 (H) 04/01/2023     ASSESSMENT:   1. Well woman exam with routine gynecological exam   2. Cervical cancer screening   3. Urinary urgency   4. Other endometriosis   5. Adenomyosis   6. Vaginal discharge      PLAN:   Donna Perry is a 48 y.o. G25P1001 female here today for her annual exam, doing well.  Pap: done with cotesting today Mammogram: UTD through PCP Colon: UTD through PCP Labs: PCP PHQ-2 = 0 Contraception: s/p BTL Healthy lifestyle modifications discussed: multivitamin, diet, exercise, sunscreen, tobacco and alcohol use. Emphasized importance of regular physical activity.   Calcium and Vit D recommendation reviewed.  All questions answered to patient's satisfaction.   Urinary urgency:  -UA in office wnl today; use home vesicare  -White discharge on exam today, could be contributing to her urgency;s/p recent abx for sinus infection -Swab obtained, empirically send Diflucan  x 2  Endometriosis/adenomyosis -Continue Myfembree  until April 2027; no prior DEXA -Increase Calcium intake  Follow up 1 yr for annual, sooner prn.    Estil Mangle, DO [facility]    [1]  Current Outpatient Medications on File Prior to Visit  Medication Sig Dispense Refill   Cyanocobalamin (B-12 PO) Take by mouth.     ELDERBERRY PO Take 5 mLs by mouth daily.     EPINEPHrine 0.3 mg/0.3 mL IJ SOAJ injection Inject 0.3 mg into the muscle as needed for anaphylaxis.      ferrous sulfate 325 (65 FE) MG EC tablet Take 325 mg by mouth 2 (two) times a day.      fexofenadine (ALLEGRA) 180 MG tablet Take 180 mg by mouth.     fluticasone (FLONASE) 50 MCG/ACT nasal spray Place 1 spray into both nostrils daily as needed for allergies.      hydrochlorothiazide (HYDRODIURIL) 25 MG tablet Take 25 mg by mouth daily.      levocetirizine (XYZAL) 5 MG tablet Take 5 mg by mouth every evening.     loratadine (CLARITIN) 10 MG tablet Take 10 mg by mouth daily.     losartan (COZAAR) 50 MG tablet Take 50 mg by mouth daily.     nystatin  (MYCOSTATIN /NYSTOP ) powder Apply 1 Bottle topically 2 (two) times daily as needed (IRRITATION).     Potassium 99 MG TABS Take 99 mg by mouth daily as needed (POTASSIUM LEVELS).     Relugolix-Estradiol -Norethind (MYFEMBREE ) 40-1-0.5 MG TABS Take 1 tablet by mouth daily. 30 tablet 11   APPLE CIDER VINEGAR PO Take by mouth. (Patient not taking: Reported on 07/21/2024)     ibuprofen  (ADVIL ) 800 MG tablet Take 1 tablet (800 mg total) by mouth every 8 (eight) hours as needed. (Patient not taking: Reported on 07/21/2024) 30 tablet 1   solifenacin  (VESICARE ) 5 MG  tablet TAKE 1 TABLET(5 MG) BY MOUTH DAILY AS NEEDED (Patient not taking: Reported on 07/21/2024) 30 tablet 3   No current facility-administered medications on file prior to visit.  [2]  Allergies Allergen Reactions   Elemental Sulfur    Sulfamethoxazole-Trimethoprim Hives

## 2024-07-21 ENCOUNTER — Ambulatory Visit: Admitting: Obstetrics

## 2024-07-21 ENCOUNTER — Encounter: Payer: Self-pay | Admitting: Obstetrics

## 2024-07-21 ENCOUNTER — Other Ambulatory Visit (HOSPITAL_COMMUNITY)
Admission: RE | Admit: 2024-07-21 | Discharge: 2024-07-21 | Disposition: A | Discharge status: Home / Self Care | Source: Ambulatory Visit | Attending: Obstetrics | Admitting: Obstetrics

## 2024-07-21 VITALS — BP 128/87 | HR 93 | Wt 158.2 lb

## 2024-07-21 DIAGNOSIS — N808 Other endometriosis: Secondary | ICD-10-CM | POA: Insufficient documentation

## 2024-07-21 DIAGNOSIS — N898 Other specified noninflammatory disorders of vagina: Secondary | ICD-10-CM | POA: Diagnosis not present

## 2024-07-21 DIAGNOSIS — N8003 Adenomyosis of the uterus: Secondary | ICD-10-CM

## 2024-07-21 DIAGNOSIS — Z01419 Encounter for gynecological examination (general) (routine) without abnormal findings: Secondary | ICD-10-CM | POA: Insufficient documentation

## 2024-07-21 DIAGNOSIS — Z01411 Encounter for gynecological examination (general) (routine) with abnormal findings: Secondary | ICD-10-CM | POA: Diagnosis not present

## 2024-07-21 DIAGNOSIS — Z124 Encounter for screening for malignant neoplasm of cervix: Secondary | ICD-10-CM

## 2024-07-21 DIAGNOSIS — R3915 Urgency of urination: Secondary | ICD-10-CM | POA: Diagnosis not present

## 2024-07-21 LAB — POCT URINALYSIS DIPSTICK
Bilirubin, UA: NEGATIVE
Blood, UA: NEGATIVE
Glucose, UA: NEGATIVE
Ketones, UA: NEGATIVE
Leukocytes, UA: NEGATIVE
Nitrite, UA: NEGATIVE
Protein, UA: NEGATIVE
Spec Grav, UA: 1.01 (ref 1.010–1.025)
Urobilinogen, UA: 0.2 U/dL
pH, UA: 7 (ref 5.0–8.0)

## 2024-07-21 MED ORDER — FLUCONAZOLE 150 MG PO TABS: 150.0000 mg | ORAL_TABLET | ORAL | 0 refills | Status: AC

## 2024-07-21 NOTE — Patient Instructions (Signed)
 Preventive Care 58-47 Years Old, Female  Preventive care refers to lifestyle choices and visits with your health care provider that can promote health and wellness. Preventive care visits are also called wellness exams.  What can I expect for my preventive care visit?  Counseling  Your health care provider may ask you questions about your:  Medical history, including:  Past medical problems.  Family medical history.  Pregnancy history.  Current health, including:  Menstrual cycle.  Method of birth control.  Emotional well-being.  Home life and relationship well-being.  Sexual activity and sexual health.  Lifestyle, including:  Alcohol, nicotine or tobacco, and drug use.  Access to firearms.  Diet, exercise, and sleep habits.  Work and work Astron[employer].  Sunscreen use.  Safety issues such as seatbelt and bike helmet use.  Physical exam  Your health care provider will check your:  Height and weight. These may be used to calculate your BMI (body mass index). BMI is a measurement that tells if you are at a healthy weight.  Waist circumference. This measures the distance around your waistline. This measurement also tells if you are at a healthy weight and may help predict your risk of certain diseases, such as type 2 diabetes and high blood pressure.  Heart rate and blood pressure.  Body temperature.  Skin for abnormal spots.  What immunizations do I need?    Vaccines are usually given at various ages, according to a schedule. Your health care provider will recommend vaccines for you based on your age, medical history, and lifestyle or other factors, such as travel or where you work.  What tests do I need?  Screening  Your health care provider may recommend screening tests for certain conditions. This may include:  Lipid and cholesterol levels.  Diabetes screening. This is done by checking your blood sugar (glucose) after you have not eaten for a while (fasting).  Pelvic exam and Pap test.  Hepatitis B test.  Hepatitis C  test.  HIV (human immunodeficiency virus) test.  STI (sexually transmitted infection) testing, if you are at risk.  Lung cancer screening.  Colorectal cancer screening.  Mammogram. Talk with your health care provider about when you should start having regular mammograms. This may depend on whether you have a family history of breast cancer.  BRCA-related cancer screening. This may be done if you have a family history of breast, ovarian, tubal, or peritoneal cancers.  Bone density scan. This is done to screen for osteoporosis.  Talk with your health care provider about your test results, treatment options, and if necessary, the need for more tests.  Follow these instructions at home:  Eating and drinking    Eat a diet that includes fresh fruits and vegetables, whole grains, lean protein, and low-fat dairy products.  Take vitamin and mineral supplements as recommended by your health care provider.  Do not drink alcohol if:  Your health care provider tells you not to drink.  You are pregnant, may be pregnant, or are planning to become pregnant.  If you drink alcohol:  Limit how much you have to 0-1 drink a day.  Know how much alcohol is in your drink. In the [place] one drink equals one 12 oz bottle of beer (355 mL), one 5 oz glass of wine (148 mL), or one 1 oz glass of hard liquor (44 mL).  Lifestyle  Brush your teeth every morning and night with fluoride toothpaste. Floss one time each day.  Exercise for at least  30 minutes 5 or more days each week.  Do not use any products that contain nicotine or tobacco. These products include cigarettes, chewing tobacco, and vaping devices, such as e-cigarettes. If you need help quitting, ask your health care provider.  Do not use drugs.  If you are sexually active, practice safe sex. Use a condom or other form of protection to prevent STIs.  If you do not wish to become pregnant, use a form of birth control. If you plan to become pregnant, see your health care provider for a  prepregnancy visit.  Take aspirin only as told by your health care provider. Make sure that you understand how much to take and what form to take. Work with your health care provider to find out whether it is safe and beneficial for you to take aspirin daily.  Find healthy ways to manage stress, such as:  Meditation, yoga, or listening to music.  Journaling.  Talking to a trusted person.  Spending time with friends and family.  Minimize exposure to UV radiation to reduce your risk of skin cancer.  Safety  Always wear your seat belt while driving or riding in a vehicle.  Do not drive:  If you have been drinking alcohol. Do not ride with someone who has been drinking.  When you are tired or distracted.  While texting.  If you have been using any mind-altering substances or drugs.  Wear a helmet and other protective equipment during sports activities.  If you have firearms in your house, make sure you follow all gun safety procedures.  Seek help if you have been physically or sexually abused.  What's next?  Visit your health care provider once a year for an annual wellness visit.  Ask your health care provider how often you should have your eyes and teeth checked.  Stay up to date on all vaccines.  This information is not intended to replace advice given to you by your health care provider. Make sure you discuss any questions you have with your health care provider.  Document Revised: 01/16/2021 Document Reviewed: 01/16/2021  Elsevier Patient Education  2024 [employer].

## 2024-07-22 LAB — CERVICOVAGINAL ANCILLARY ONLY
Bacterial Vaginitis (gardnerella): POSITIVE — AB
Candida Glabrata: NEGATIVE
Candida Vaginitis: NEGATIVE
Comment: NEGATIVE
Comment: NEGATIVE
Comment: NEGATIVE

## 2024-07-25 LAB — CYTOLOGY - PAP
Comment: NEGATIVE
Diagnosis: NEGATIVE
High risk HPV: NEGATIVE

## 2024-07-26 ENCOUNTER — Encounter: Payer: Self-pay | Admitting: Obstetrics

## 2024-07-29 NOTE — Telephone Encounter (Signed)
 Spoke with patient. Advised pharmacy will send us  a refill request once they fill the last refill on current rx. We will fill and it will be good for one year which will take her to 11/2025 (2 year limit of meds). Patient voices understanding.

## 2024-08-08 ENCOUNTER — Ambulatory Visit: Payer: Self-pay | Admitting: Obstetrics

## 2024-08-16 ENCOUNTER — Other Ambulatory Visit: Payer: Self-pay

## 2024-08-16 DIAGNOSIS — B9689 Other specified bacterial agents as the cause of diseases classified elsewhere: Secondary | ICD-10-CM

## 2024-08-16 MED ORDER — METRONIDAZOLE 500 MG PO TABS: 500.0000 mg | ORAL_TABLET | Freq: Two times a day (BID) | ORAL | 0 refills | Status: AC
